# Patient Record
Sex: Male | Born: 1955 | Race: White | Hispanic: No | Smoking: Former smoker
Health system: Southern US, Community
[De-identification: ages and names within clinical notes are randomized; demographics above are authoritative.]

## PROBLEM LIST (undated history)

## (undated) DIAGNOSIS — Z8619 Personal history of other infectious and parasitic diseases: Secondary | ICD-10-CM

## (undated) DIAGNOSIS — E781 Pure hyperglyceridemia: Secondary | ICD-10-CM

## (undated) HISTORY — DX: Personal history of other infectious and parasitic diseases: Z86.19

## (undated) HISTORY — DX: Pure hyperglyceridemia: E78.1

---

## 1981-09-05 HISTORY — PX: WISDOM TOOTH EXTRACTION: SHX21

## 1999-03-22 ENCOUNTER — Emergency Department (HOSPITAL_COMMUNITY): Admission: EM | Admit: 1999-03-22 | Discharge: 1999-03-22 | Payer: Self-pay | Admitting: Emergency Medicine

## 2013-04-29 ENCOUNTER — Emergency Department: Payer: Self-pay | Admitting: Emergency Medicine

## 2013-04-29 LAB — BASIC METABOLIC PANEL
Anion Gap: 5 — ABNORMAL LOW (ref 7–16)
BUN: 20 mg/dL — ABNORMAL HIGH (ref 7–18)
Calcium, Total: 9.1 mg/dL (ref 8.5–10.1)
Chloride: 108 mmol/L — ABNORMAL HIGH (ref 98–107)
Co2: 26 mmol/L (ref 21–32)
EGFR (Non-African Amer.): >60
Glucose: 107 mg/dL — ABNORMAL HIGH (ref 65–99)
Osmolality: 281 (ref 275–301)
Potassium: 4.4 mmol/L (ref 3.5–5.1)
Sodium: 139 mmol/L (ref 136–145)

## 2013-04-29 LAB — CBC
HCT: 46.6 % (ref 40.0–52.0)
MCV: 85 fL (ref 80–100)
Platelet: 218 10*3/uL (ref 150–440)
RDW: 13 % (ref 11.5–14.5)
WBC: 7.8 10*3/uL (ref 3.8–10.6)

## 2014-03-31 LAB — LIPID PANEL
Cholesterol: 156 mg/dL (ref 0–200)
HDL: 39 mg/dL (ref 35–70)
LDL CALC: 98 mg/dL
Triglycerides: 97 mg/dL (ref 40–160)

## 2014-03-31 LAB — PSA: PSA: 0.6

## 2015-09-15 ENCOUNTER — Other Ambulatory Visit: Payer: Self-pay | Admitting: Family Medicine

## 2015-09-15 NOTE — Telephone Encounter (Signed)
OK to send, but I can't pharmacy in [place].

## 2015-09-15 NOTE — Telephone Encounter (Signed)
Ok to send in medication into pharmacy?

## 2015-09-15 NOTE — Telephone Encounter (Signed)
Pt contacted office for refill request on the following medications: Lisinopril 20 mg to [facility]. Pt last OV was on 03/24/14 and last RX was 05/07/14. Pt is scheduled for f/u on 09/21/15 but he is out and would like the refill sent in today if possible. Thanks TNP

## 2015-09-18 DIAGNOSIS — I10 Essential (primary) hypertension: Secondary | ICD-10-CM | POA: Insufficient documentation

## 2015-09-18 DIAGNOSIS — E781 Pure hyperglyceridemia: Secondary | ICD-10-CM | POA: Insufficient documentation

## 2015-09-18 HISTORY — DX: Pure hyperglyceridemia: E78.1

## 2015-09-21 ENCOUNTER — Ambulatory Visit (INDEPENDENT_AMBULATORY_CARE_PROVIDER_SITE_OTHER): Payer: BLUE CROSS/BLUE SHIELD | Admitting: Family Medicine

## 2015-09-21 ENCOUNTER — Encounter: Payer: Self-pay | Admitting: Family Medicine

## 2015-09-21 VITALS — BP 142/80 | HR 72 | Temp 98.3°F | Resp 16 | Ht 70.0 in | Wt 246.0 lb

## 2015-09-21 DIAGNOSIS — Z23 Encounter for immunization: Secondary | ICD-10-CM | POA: Diagnosis not present

## 2015-09-21 DIAGNOSIS — I10 Essential (primary) hypertension: Secondary | ICD-10-CM

## 2015-09-21 DIAGNOSIS — E781 Pure hyperglyceridemia: Secondary | ICD-10-CM | POA: Diagnosis not present

## 2015-09-21 MED ORDER — LISINOPRIL 20 MG PO TABS: 20.0000 mg | ORAL_TABLET | Freq: Every day | ORAL | Status: DC

## 2015-09-21 NOTE — Patient Instructions (Signed)
DASH Eating Plan  DASH stands for "Dietary Approaches to Stop Hypertension." The DASH eating plan is a healthy eating plan that has been shown to reduce high blood pressure (hypertension). Additional health benefits may include reducing the risk of type 2 diabetes mellitus, heart disease, and stroke. The DASH eating plan may also help with weight loss.  WHAT DO I NEED TO KNOW ABOUT THE DASH EATING PLAN?  For the DASH eating plan, you will follow these general guidelines:  · Choose foods with a percent daily value for sodium of less than 5% (as listed on the food label).  · Use salt-free seasonings or herbs instead of table salt or sea salt.  · Check with your health care provider or pharmacist before using salt substitutes.  · Eat lower-sodium products, often labeled as "lower sodium" or "no salt added."  · Eat fresh foods.  · Eat more vegetables, fruits, and low-fat dairy products.  · Choose whole grains. Look for the word "whole" as the first word in the ingredient list.  · Choose fish and skinless chicken or turkey more often than red meat. Limit fish, poultry, and meat to 6 oz (170 g) each day.  · Limit sweets, desserts, sugars, and sugary drinks.  · Choose heart-healthy fats.  · Limit cheese to 1 oz (28 g) per day.  · Eat more home-cooked food and less restaurant, buffet, and fast food.  · Limit fried foods.  · Cook foods using methods other than frying.  · Limit canned vegetables. If you do use them, rinse them well to decrease the sodium.  · When eating at a restaurant, ask that your food be prepared with less salt, or no salt if possible.  WHAT FOODS CAN I EAT?  Seek help from a dietitian for individual calorie needs.  Grains  Whole grain or whole wheat bread. Brown rice. Whole grain or whole wheat pasta. Quinoa, bulgur, and whole grain cereals. Low-sodium cereals. Corn or whole wheat flour tortillas. Whole grain cornbread. Whole grain crackers. Low-sodium crackers.  Vegetables  Fresh or frozen vegetables  (raw, steamed, roasted, or grilled). Low-sodium or reduced-sodium tomato and vegetable juices. Low-sodium or reduced-sodium tomato sauce and paste. Low-sodium or reduced-sodium canned vegetables.   Fruits  All fresh, canned (in natural juice), or frozen fruits.  Meat and Other Protein Products  Ground beef (85% or leaner), grass-fed beef, or beef trimmed of fat. Skinless chicken or turkey. Ground chicken or turkey. Pork trimmed of fat. All fish and seafood. Eggs. Dried beans, peas, or lentils. Unsalted nuts and seeds. Unsalted canned beans.  Dairy  Low-fat dairy products, such as skim or 1% milk, 2% or reduced-fat cheeses, low-fat ricotta or cottage cheese, or plain low-fat yogurt. Low-sodium or reduced-sodium cheeses.  Fats and Oils  Tub margarines without trans fats. Light or reduced-fat mayonnaise and salad dressings (reduced sodium). Avocado. Safflower, olive, or canola oils. Natural peanut or almond butter.  Other  Unsalted popcorn and pretzels.  The items listed above may not be a complete list of recommended foods or beverages. Contact your dietitian for more options.  WHAT FOODS ARE NOT RECOMMENDED?  Grains  White bread. White pasta. White rice. Refined cornbread. Bagels and croissants. Crackers that contain trans fat.  Vegetables  Creamed or fried vegetables. Vegetables in a cheese sauce. Regular canned vegetables. Regular canned tomato sauce and paste. Regular tomato and vegetable juices.  Fruits  Dried fruits. Canned fruit in light or heavy syrup. Fruit juice.  Meat and Other Protein   Products  Fatty cuts of meat. Ribs, chicken wings, bacon, sausage, bologna, salami, chitterlings, fatback, hot dogs, bratwurst, and packaged luncheon meats. Salted nuts and seeds. Canned beans with salt.  Dairy  Whole or 2% milk, cream, half-and-half, and cream cheese. Whole-fat or sweetened yogurt. Full-fat cheeses or blue cheese. Nondairy creamers and whipped toppings. Processed cheese, cheese spreads, or cheese  curds.  Condiments  Onion and garlic salt, seasoned salt, table salt, and sea salt. Canned and packaged gravies. Worcestershire sauce. Tartar sauce. Barbecue sauce. Teriyaki sauce. Soy sauce, including reduced sodium. Steak sauce. Fish sauce. Oyster sauce. Cocktail sauce. Horseradish. Ketchup and mustard. Meat flavorings and tenderizers. Bouillon cubes. Hot sauce. Tabasco sauce. Marinades. Taco seasonings. Relishes.  Fats and Oils  Butter, stick margarine, lard, shortening, ghee, and bacon fat. Coconut, palm kernel, or palm oils. Regular salad dressings.  Other  Pickles and olives. Salted popcorn and pretzels.  The items listed above may not be a complete list of foods and beverages to avoid. Contact your dietitian for more information.  WHERE CAN I FIND MORE INFORMATION?  National Heart, Lung, and Blood Institute: www.nhlbi.nih.gov/health/health-topics/topics/dash/     This information is not intended to replace advice given to you by your health care provider. Make sure you discuss any questions you have with your health care provider.     Document Released: 08/11/2011 Document Revised: 09/12/2014 Document Reviewed: 06/26/2013  Elsevier Interactive Patient Education ©2016 Elsevier Inc.

## 2015-09-21 NOTE — Progress Notes (Signed)
Patient ID: Jared Mccann, male   DOB: 05/26/1956, 60 y.o.   MRN: [id number]       Patient: Jared Mccann Male    DOB: Jan 09, 1956   60 y.o.   MRN: [id number] Visit Date: 09/21/2015  Today's Provider: Lelon Huh, MD   Chief Complaint  Patient presents with  . Hypertension    follow up   . Hyperlipidemia    follow up    Subjective:    HPI  Hypertension, follow-up:  BP Readings from Last 3 Encounters:  09/21/15 142/80  03/24/14 116/80    He was last seen for hypertension 1 years ago.  BP at that visit was 116/80. Management since that visit includes no changes. He reports fair compliance with treatment. He has only taken lisinopril twice in the last week due to medication running low, but had previously been taking it consistently every day. He is not having side effects.  He is not exercising. Outside blood pressures are not being checked. He is experiencing none.  Patient denies none.   Cardiovascular risk factors include dyslipidemia.    Weight trend: fluctuating a bit Wt Readings from Last 3 Encounters:  09/21/15 246 lb (111.585 kg)  03/24/14 237 lb (107.502 kg)    Current diet: well balanced  Lipid/Cholesterol, Follow-up:   Last seen for this1 years ago.  Management changes since that visit include none. . Last Lipid Panel:    Component Value Date/Time   CHOL 156 03/31/2014   TRIG 97 03/31/2014   HDL 39 03/31/2014   LDLCALC 98 03/31/2014    Risk factors for vascular disease include hypertension  He reports excellent compliance with treatment. He is not having side effects.  Current symptoms include none and have been stable. Weight trend: stable Prior visit with dietician: no Current diet: well balanced Current exercise: none  Wt Readings from Last 3 Encounters:  09/21/15 246 lb (111.585 kg)  03/24/14 237 lb (107.502 kg)    ------------------------------------------------------------------------       No Known  Allergies Previous Medications   ASPIRIN 81 MG TABLET    Take 1 tablet by mouth daily.   GLUCOSAMINE-CHONDROIT-VIT C-MN PO    Take 1 tablet by mouth daily.   KRILL OIL 300 MG CAPS    Take 1 tablet by mouth daily.   LISINOPRIL (PRINIVIL,ZESTRIL) 20 MG TABLET    Take 1 tablet by mouth daily.   MULTIPLE VITAMINS-MINERALS (MULTIVITAMIN ADULT PO)    Take 1 tablet by mouth daily.    Review of Systems  Constitutional: Negative.   Respiratory: Negative.   Cardiovascular: Negative.   Musculoskeletal: Negative.   Neurological: Negative.     Social History  Substance Use Topics  . Smoking status: Former Smoker -- 0.50 packs/day for 10 years    Types: Cigarettes    Quit date: 09/06/1983  . Smokeless tobacco: Not on file  . Alcohol Use: 0.0 oz/week    0 Standard drinks or equivalent per week     Comment: occasional use   Objective:   BP 142/80 mmHg  Pulse 72  Temp(Src) 98.3 F (36.8 C)  Resp 16  Ht 5\' 10"  (1.778 m)  Wt 246 lb (111.585 kg)  BMI 35.30 kg/m2  SpO2 95%  Physical Exam  General Appearance:    Alert, cooperative, no distress, obese  Eyes:    PERRL, conjunctiva/corneas clear, EOM's intact       Lungs:     Clear to auscultation bilaterally, respirations unlabored  Heart:  Regular rate and rhythm  Neurologic:   Awake, alert, oriented x 3. No apparent focal neurological           defect.           Assessment & Plan:     1. Hypertriglyceridemia  - Lipid panel  2. Essential (primary) hypertension Up today since running out of medication. Check labs and restart lisinopril - Renal function panel - lisinopril (PRINIVIL,ZESTRIL) 20 MG tablet; Take 1 tablet (20 mg total) by mouth daily.  Dispense: 30 tablet; Refill: 12  3. Need for influenza vaccination  - Flu Vaccine QUAD 36+ mos IM  4. Need for Tdap vaccination  - Tdap vaccine greater than or equal to 7yo IM       Lelon Huh, MD  [facility]

## 2015-09-22 ENCOUNTER — Encounter: Payer: Self-pay | Admitting: Family Medicine

## 2015-09-25 MED ORDER — LISINOPRIL 20 MG PO TABS: 20.0000 mg | ORAL_TABLET | Freq: Every day | ORAL | Status: DC

## 2015-09-28 LAB — BASIC METABOLIC PANEL
BUN: 14 mg/dL (ref 4–21)
Creatinine: 1.3 mg/dL (ref 0.6–1.3)
GLUCOSE: 75 mg/dL
Potassium: 4.9 mmol/L (ref 3.4–5.3)
SODIUM: 141 mmol/L (ref 137–147)

## 2015-09-28 LAB — LIPID PANEL
Cholesterol: 159 mg/dL (ref 0–200)
HDL: 39 mg/dL (ref 35–70)
LDL CALC: 97 mg/dL
Triglycerides: 115 mg/dL (ref 40–160)

## 2015-09-30 ENCOUNTER — Encounter: Payer: Self-pay | Admitting: Family Medicine

## 2015-09-30 ENCOUNTER — Ambulatory Visit (INDEPENDENT_AMBULATORY_CARE_PROVIDER_SITE_OTHER): Payer: BLUE CROSS/BLUE SHIELD | Admitting: Family Medicine

## 2015-09-30 VITALS — BP 138/78 | HR 68 | Temp 98.7°F | Resp 16

## 2015-09-30 DIAGNOSIS — I10 Essential (primary) hypertension: Secondary | ICD-10-CM | POA: Diagnosis not present

## 2015-09-30 DIAGNOSIS — H60322 Hemorrhagic otitis externa, left ear: Secondary | ICD-10-CM

## 2015-09-30 MED ORDER — NEOMYCIN-POLYMYXIN-HC 3.5-10000-1 OT SOLN: 3.0000 [drp] | Freq: Four times a day (QID) | OTIC | Status: DC

## 2015-09-30 NOTE — Progress Notes (Signed)
Patient ID: Jared Mccann, male   DOB: 07-25-1956, 60 y.o.   MRN: [id number]       Patient: Jared Mccann Male    DOB: 01-Dec-1955   60 y.o.   MRN: [id number] Visit Date: 09/30/2015  Today's Provider: Lelon Huh, MD   Chief Complaint  Patient presents with  . Ear Pain    left ear X 1 week.    Subjective:    Otalgia  There is pain in the left ear. This is a new problem. The current episode started in the past 7 days. The problem has been gradually worsening. There has been no fever. The pain is at a severity of 4/10. The pain is mild. Pertinent negatives include no coughing, ear discharge, hearing loss or neck pain. He has tried acetaminophen for the symptoms. The treatment provided no relief.  Patient reports that the pain radiates to the left side of his jaw. He reports that the pain is worse when opening and closing his mouth.    -------------------------------------------------------------------   Hypertension, follow-up:  BP Readings from Last 3 Encounters:  09/30/15 138/78  09/21/15 142/80  03/24/14 116/80   He reports good compliance with treatment. He is not having side effects.  He is not exercising. He is adherent to low salt diet.   Outside blood pressures are normal.  Patient denies chest pain, dyspnea, fatigue, irregular heart beat, lower extremity edema, orthopnea and palpitations.     He had labs done yesterday with normal lipids and renal panel.   Weight trend: stable Wt Readings from Last 3 Encounters:  09/21/15 246 lb (111.585 kg)  03/24/14 237 lb (107.502 kg)    ------------------------------------------------------------------------  Anxiety He reports he was previously on lithium carbonate with remote diagnosis of BPAD. He states that lithium really helped calms his nerves when he took it the past and he feels he needs to be back on it. Was last prescribed 300mg  twice a day in 2008.      No Known Allergies Previous Medications   ASPIRIN  81 MG TABLET    Take 1 tablet by mouth daily.   GLUCOSAMINE-CHONDROIT-VIT C-MN PO    Take 1 tablet by mouth daily.   KRILL OIL 300 MG CAPS    Take 1 tablet by mouth daily.   LISINOPRIL (PRINIVIL,ZESTRIL) 20 MG TABLET    Take 1 tablet (20 mg total) by mouth daily.   MULTIPLE VITAMINS-MINERALS (MULTIVITAMIN ADULT PO)    Take 1 tablet by mouth daily.    Review of Systems  Constitutional: Negative.   HENT: Positive for ear pain. Negative for congestion, ear discharge, hearing loss, postnasal drip and sinus pressure.   Respiratory: Negative for cough.   Musculoskeletal: Negative for neck pain.    Social History  Substance Use Topics  . Smoking status: Former Smoker -- 0.50 packs/day for 10 years    Types: Cigarettes    Quit date: 09/06/1983  . Smokeless tobacco: Not on file  . Alcohol Use: 0.0 oz/week    0 Standard drinks or equivalent per week     Comment: occasional use   Objective:   BP 138/78 mmHg  Pulse 68  Temp(Src) 98.7 F (37.1 C)  Resp 16  Wt   SpO2 96%  Physical Exam  General appearance: alert, well developed, well nourished, cooperative and in no distress Head: Normocephalic, without obvious abnormality, atraumatic Lungs: Respirations even and unlabored Extremities: No gross deformities Ear: Left ear canal with small amount cerumen. Mild erythema and slightly  swollen. TM clear Psych: Appropriate mood and affect. Neurologic: Mental status: Alert, oriented to person, place, and time, thought content appropriate.     Assessment & Plan:     1. Otitis externa hemorrhagica, left  - neomycin-polymyxin-hydrocortisone (CORTISPORIN) otic solution; Place 3 drops into the left ear 4 (four) times daily.  Dispense: 10 mL; Refill: 0  2. Essential (primary) hypertension Well controlled.  Continue current medications.    3. Anxiety By his history has remote diagnosis of BPAD. Will start back on lithium carbonate 300mg  twice a day.        Lelon Huh, MD  [facility]

## 2015-10-02 ENCOUNTER — Telehealth: Payer: Self-pay | Admitting: Family Medicine

## 2015-10-07 ENCOUNTER — Encounter: Payer: Self-pay | Admitting: Family Medicine

## 2016-03-25 ENCOUNTER — Telehealth: Payer: Self-pay | Admitting: Family Medicine

## 2016-03-25 MED ORDER — LISINOPRIL 20 MG PO TABS: 20.0000 mg | ORAL_TABLET | Freq: Every day | ORAL | Status: DC

## 2016-03-25 NOTE — Telephone Encounter (Signed)
Please review-aa 

## 2016-03-25 NOTE — Telephone Encounter (Signed)
Pt contacted office for refill request on the following medications: lisinopril (PRINIVIL,ZESTRIL) 20 MG tablet to [facility]. Last written: 09/25/15 90 supply no refills Last OV: 09/30/15 Pt scheduled a F/U OV for 04/25/16. Thanks TNP

## 2016-04-08 NOTE — Telephone Encounter (Signed)
error 

## 2016-04-11 ENCOUNTER — Telehealth: Payer: Self-pay | Admitting: Family Medicine

## 2016-04-11 MED ORDER — LITHIUM CARBONATE 300 MG PO TABS: 300.0000 mg | ORAL_TABLET | Freq: Two times a day (BID) | ORAL | 1 refills | Status: DC

## 2016-04-11 NOTE — Telephone Encounter (Signed)
Refill lithium that was restarted at 09-30-15 o.v. For anxiety

## 2016-04-25 ENCOUNTER — Ambulatory Visit: Payer: BLUE CROSS/BLUE SHIELD | Admitting: Family Medicine

## 2016-05-16 ENCOUNTER — Ambulatory Visit: Payer: BLUE CROSS/BLUE SHIELD | Admitting: Family Medicine

## 2017-01-13 ENCOUNTER — Other Ambulatory Visit: Payer: Self-pay

## 2017-01-13 MED ORDER — LISINOPRIL 20 MG PO TABS: 20.0000 mg | ORAL_TABLET | Freq: Every day | ORAL | 0 refills | Status: DC

## 2017-01-13 NOTE — Telephone Encounter (Signed)
Patient called needing refill for his Lisinopril. I made his appointment to see you on Wednesday 01/16/17, due to his work schedule that was the earliest I could get him in. I updated his pharmacy in the chart. Please review. Thank you-aa

## 2017-01-18 ENCOUNTER — Ambulatory Visit (INDEPENDENT_AMBULATORY_CARE_PROVIDER_SITE_OTHER): Payer: BLUE CROSS/BLUE SHIELD | Admitting: Family Medicine

## 2017-01-18 ENCOUNTER — Encounter: Payer: Self-pay | Admitting: Family Medicine

## 2017-01-18 VITALS — BP 120/80 | HR 70 | Temp 98.8°F | Resp 16 | Ht 70.0 in | Wt 250.0 lb

## 2017-01-18 DIAGNOSIS — Z1159 Encounter for screening for other viral diseases: Secondary | ICD-10-CM

## 2017-01-18 DIAGNOSIS — Z1211 Encounter for screening for malignant neoplasm of colon: Secondary | ICD-10-CM

## 2017-01-18 DIAGNOSIS — I10 Essential (primary) hypertension: Secondary | ICD-10-CM | POA: Diagnosis not present

## 2017-01-18 DIAGNOSIS — Z125 Encounter for screening for malignant neoplasm of prostate: Secondary | ICD-10-CM | POA: Diagnosis not present

## 2017-01-18 MED ORDER — NEOMYCIN-POLYMYXIN-HC 3.5-10000-1 OT SOLN: 3.0000 [drp] | Freq: Four times a day (QID) | OTIC | 0 refills | Status: DC

## 2017-01-18 NOTE — Progress Notes (Signed)
Patient: Jared Mccann Male    DOB: Mar 22, 1956   61 y.o.   MRN: [id number] Visit Date: 01/18/2017  Today's Provider: Lelon Huh, MD   Chief Complaint  Patient presents with  . Follow-up  . Hypertension  . Hyperlipidemia   Subjective:    HPI   Hypertension, follow-up:  BP Readings from Last 3 Encounters:  01/18/17 120/80  09/30/15 138/78  09/21/15 (!) 142/80    He was last seen for hypertension 09/21/2015.  BP at that visit was 142/80. Management since that visit includes; restarted Lisinopril.He reports good compliance with treatment. He is not having side effects. none He is exercising. He is adherent to low salt diet.   Outside blood pressures are usually high. He is experiencing none.  Patient denies none.   Cardiovascular risk factors include none.  Use of agents associated with hypertension: none.   ----------------------------------------------------------------    Lipid/Cholesterol, Follow-up:   Last seen for this 09/21/2015.  Management since that visit includes; labs checked, no changes.  Last Lipid Panel:    Component Value Date/Time   CHOL 159 09/28/2015   TRIG 115 09/28/2015   HDL 39 09/28/2015   LDLCALC 97 09/28/2015    He reports good compliance with treatment. He is not having side effects. none  Wt Readings from Last 3 Encounters:  01/18/17 250 lb (113.4 kg)  09/21/15 246 lb (111.6 kg)  03/24/14 237 lb (107.5 kg)    ----------------------------------------------------------------    No Known Allergies   Current Outpatient Prescriptions:  .  aspirin 81 MG tablet, Take 1 tablet by mouth daily., Disp: , Rfl:  .  Doxylamine Succinate, Sleep, (SLEEP AID PO), Take by mouth. Nyquil, Disp: , Rfl:  .  GLUCOSAMINE-CHONDROIT-VIT C-MN PO, Take 1 tablet by mouth daily., Disp: , Rfl:  .  Krill Oil 300 MG CAPS, Take 1 tablet by mouth daily., Disp: , Rfl:  .  lisinopril (PRINIVIL,ZESTRIL) 20 MG tablet, Take 1 tablet (20 mg total)  by mouth daily., Disp: 90 tablet, Rfl: 0 .  MELATONIN PO, Take by mouth., Disp: , Rfl:  .  Multiple Vitamins-Minerals (MULTIVITAMIN ADULT PO), Take 1 tablet by mouth daily., Disp: , Rfl:  .  neomycin-polymyxin-hydrocortisone (CORTISPORIN) otic solution, Place 3 drops into the left ear 4 (four) times daily., Disp: 10 mL, Rfl: 0 .  Saw Palmetto, Serenoa repens, (SAW PALMETTO PO), Take by mouth., Disp: , Rfl:  .  TURMERIC PO, Take by mouth., Disp: , Rfl:   Review of Systems  Constitutional: Negative for appetite change, chills and fever.  Respiratory: Negative for chest tightness, shortness of breath and wheezing.   Cardiovascular: Negative for chest pain and palpitations.  Gastrointestinal: Negative for abdominal pain, nausea and vomiting.    Social History  Substance Use Topics  . Smoking status: Former Smoker    Packs/day: 0.50    Years: 10.00    Types: Cigarettes    Quit date: 09/06/1983  . Smokeless tobacco: Never Used  . Alcohol use 0.0 oz/week     Comment: occasional use   Objective:   BP 120/80 (BP Location: Right Arm, Patient Position: Sitting, Cuff Size: Large)   Pulse 70   Temp 98.8 F (37.1 C) (Oral)   Resp 16   Ht '5\' 10"'$  (1.778 m)   Wt 250 lb (113.4 kg)   SpO2 97%   BMI 35.87 kg/m  Vitals:   01/18/17 1131  BP: 120/80  Pulse: 70  Resp: 16  Temp: 98.8  F (37.1 C)  TempSrc: Oral  SpO2: 97%  Weight: 250 lb (113.4 kg)  Height: '5\' 10"'$  (1.778 m)   Depression screen [facility] 2/9 01/18/2017  Decreased Interest 0  Down, Depressed, Hopeless 1  PHQ - 2 Score 1  Altered sleeping 0  Tired, decreased energy 1  Change in appetite 0  Feeling bad or failure about yourself  0  Trouble concentrating 0  Moving slowly or fidgety/restless 0  Suicidal thoughts 0  PHQ-9 Score 2  Difficult doing work/chores Not difficult at all     Physical Exam   General Appearance:    Alert, cooperative, no distress  Eyes:    PERRL, conjunctiva/corneas clear, EOM's intact       Lungs:      Clear to auscultation bilaterally, respirations unlabored  Heart:    Regular rate and rhythm  Neurologic:   Awake, alert, oriented x 3. No apparent focal neurological           defect.           Assessment & Plan:     1. Essential (primary) hypertension Well controlled.  Continue current medications.   - Lipid panel - Renal function panel  2. Prostate cancer screening  - PSA  3. Need for hepatitis C screening test  - Hepatitis C antibody  4. Colon cancer screening Patient given OC-Lyte iFOBT collection kit today.   The entirety of the information documented in the History of Present Illness, Review of Systems and Physical Exam were personally obtained by me. Portions of this information were initially documented by April M. Sabra Heck, CMA and reviewed by me for thoroughness and accuracy.        Lelon Huh, MD  [facility]

## 2017-01-23 ENCOUNTER — Other Ambulatory Visit (INDEPENDENT_AMBULATORY_CARE_PROVIDER_SITE_OTHER): Payer: BLUE CROSS/BLUE SHIELD

## 2017-01-23 ENCOUNTER — Other Ambulatory Visit: Payer: Self-pay | Admitting: Family Medicine

## 2017-01-23 ENCOUNTER — Telehealth: Payer: Self-pay

## 2017-01-23 DIAGNOSIS — Z1211 Encounter for screening for malignant neoplasm of colon: Secondary | ICD-10-CM

## 2017-01-23 DIAGNOSIS — R195 Other fecal abnormalities: Secondary | ICD-10-CM | POA: Insufficient documentation

## 2017-01-23 LAB — BASIC METABOLIC PANEL
BUN: 18 mg/dL (ref 4–21)
Creatinine: 1.2 mg/dL (ref 0.6–1.3)
GLUCOSE: 87 mg/dL
Potassium: 4.6 mmol/L (ref 3.4–5.3)
Sodium: 140 mmol/L (ref 137–147)

## 2017-01-23 LAB — LIPID PANEL
CHOLESTEROL: 177 mg/dL (ref 0–200)
HDL: 33 mg/dL — AB (ref 35–70)
LDL Cholesterol: 110 mg/dL
Triglycerides: 216 mg/dL — AB (ref 40–160)

## 2017-01-23 LAB — IFOBT (OCCULT BLOOD): IFOBT: POSITIVE

## 2017-01-23 LAB — PSA: PSA: 0.9

## 2017-01-23 NOTE — Telephone Encounter (Signed)
Pt was advised after he dropped off the sample that this was positive, and he was going to be referred to GI. Renaldo Fiddler, CMA

## 2017-01-23 NOTE — Telephone Encounter (Signed)
-----   Message from Birdie Sons, MD sent at 01/23/2017  1:27 PM EDT ----- Needs referral to GI for heme positive stool.

## 2017-01-23 NOTE — Progress Notes (Signed)
Refer colonosocpy

## 2017-01-23 NOTE — Progress Notes (Signed)
Pt dropped off OC lyte sample, and it was positive. Please advise. Renaldo Fiddler, CMA

## 2017-01-24 LAB — RENAL FUNCTION PANEL
ALBUMIN: 4.3
CALCIUM: 9.4 mg/dL
Carbon Dioxide, Total: 24
Chloride: 107 mmol/L
EGFR (African American): 78
EGFR (Non-African Amer.): 67
PHOSPHORUS: 3.1

## 2017-01-24 LAB — LIPID PANEL
CHOL/HDL RATIO: 5.4
NONHDL CHOLESTEROL: 144

## 2017-01-25 ENCOUNTER — Encounter: Payer: Self-pay | Admitting: *Deleted

## 2017-01-27 ENCOUNTER — Encounter: Payer: Self-pay | Admitting: Internal Medicine

## 2017-02-02 ENCOUNTER — Encounter: Payer: Self-pay | Admitting: Family Medicine

## 2017-02-02 ENCOUNTER — Ambulatory Visit
Admission: RE | Admit: 2017-02-02 | Discharge: 2017-02-02 | Disposition: A | Discharge status: Home / Self Care | Payer: BLUE CROSS/BLUE SHIELD | Source: Ambulatory Visit | Attending: Chiropractic Medicine | Admitting: Chiropractic Medicine

## 2017-02-02 ENCOUNTER — Other Ambulatory Visit: Payer: Self-pay | Admitting: Chiropractic Medicine

## 2017-02-02 DIAGNOSIS — M25511 Pain in right shoulder: Secondary | ICD-10-CM

## 2017-02-22 ENCOUNTER — Telehealth: Payer: Self-pay | Admitting: Family Medicine

## 2017-02-22 NOTE — Telephone Encounter (Signed)
Patient was given lab results from May 2018.

## 2017-02-22 NOTE — Telephone Encounter (Signed)
Pt is requesting lab results.  XA#[phone]

## 2017-03-22 ENCOUNTER — Telehealth: Payer: Self-pay | Admitting: *Deleted

## 2017-03-22 NOTE — Telephone Encounter (Signed)
Patient no show pre-visit appointment today. Called patient. No answer, no voicemail, unable to leave message.

## 2017-03-23 ENCOUNTER — Encounter: Payer: Self-pay | Admitting: Family Medicine

## 2017-04-05 ENCOUNTER — Encounter: Payer: Self-pay | Admitting: Internal Medicine

## 2017-04-19 ENCOUNTER — Other Ambulatory Visit: Payer: Self-pay | Admitting: Family Medicine

## 2017-04-19 MED ORDER — LISINOPRIL 20 MG PO TABS: 20.0000 mg | ORAL_TABLET | Freq: Every day | ORAL | 3 refills | Status: DC

## 2017-04-19 NOTE — Telephone Encounter (Signed)
Pt needs refill on his Lisinopril 20 mg  [facility]  (differnt pharmacy) [phone]  Pt's call back [phone]  Thanks Con Memos

## 2017-04-19 NOTE — Telephone Encounter (Signed)
No answer or voice mail

## 2017-05-06 DEATH — deceased

## 2018-05-29 ENCOUNTER — Other Ambulatory Visit: Payer: Self-pay | Admitting: Family Medicine

## 2018-05-29 MED ORDER — LISINOPRIL 20 MG PO TABS: 20.0000 mg | ORAL_TABLET | Freq: Every day | ORAL | 1 refills | Status: DC

## 2018-05-29 NOTE — Telephone Encounter (Signed)
Please advise. Patient has an appointment scheduled on 06/04/2018 at 8:40am.

## 2018-05-29 NOTE — Telephone Encounter (Signed)
Pt needing a refill on: lisinopril (PRINIVIL,ZESTRIL) 20 MG tablet    Please fill at:  [facility] [address], [place] AT [facility] OF [place] [phone] (Phone) [phone] (Fax)    Thanks, [employer]

## 2018-06-04 ENCOUNTER — Ambulatory Visit: Payer: BLUE CROSS/BLUE SHIELD | Admitting: Family Medicine

## 2018-06-04 ENCOUNTER — Encounter: Payer: Self-pay | Admitting: Family Medicine

## 2018-06-04 VITALS — BP 122/80 | HR 58 | Temp 97.5°F | Resp 16 | Wt 229.0 lb

## 2018-06-04 DIAGNOSIS — I10 Essential (primary) hypertension: Secondary | ICD-10-CM

## 2018-06-04 DIAGNOSIS — Z23 Encounter for immunization: Secondary | ICD-10-CM | POA: Diagnosis not present

## 2018-06-04 DIAGNOSIS — Z125 Encounter for screening for malignant neoplasm of prostate: Secondary | ICD-10-CM

## 2018-06-04 DIAGNOSIS — R195 Other fecal abnormalities: Secondary | ICD-10-CM | POA: Diagnosis not present

## 2018-06-04 MED ORDER — LISINOPRIL 20 MG PO TABS: 20.0000 mg | ORAL_TABLET | Freq: Every day | ORAL | 1 refills | Status: DC

## 2018-06-04 NOTE — Progress Notes (Signed)
Patient: Jared Mccann Male    DOB: 10-Sep-1955   62 y.o.   MRN: [id number] Visit Date: 06/04/2018  Today's Provider: Lelon Huh, MD   Chief Complaint  Patient presents with  . Hypertension   Subjective:    HPI   Hypertension, follow-up:  BP Readings from Last 3 Encounters:  06/04/18 122/80  01/18/17 120/80  09/30/15 138/78    He was last seen for hypertension 4 months ago.  BP at that visit was 120/80. Management since that visit includes; no changes.He reports good compliance with treatment. He is not having side effects.  He is not exercising regularly. He is adherent to low salt diet.   Outside blood pressures are checked occasionally. He is experiencing none.  Patient denies chest pain, chest pressure/discomfort, claudication, dyspnea, exertional chest pressure/discomfort, fatigue, irregular heart beat, lower extremity edema, near-syncope, orthopnea, palpitations, paroxysmal nocturnal dyspnea, syncope and tachypnea.   Cardiovascular risk factors include advanced age (older than 27 for men, 61 for women), hypertension and male gender.  Use of agents associated with hypertension: none.   Wt Readings from Last 5 Encounters:  06/04/18 229 lb (103.9 kg)  01/18/17 250 lb (113.4 kg)  09/21/15 246 lb (111.6 kg)  03/24/14 237 lb (107.5 kg)   He states he has been working on losing weight and was on strict keto diet for awhile. Has relaxed diet a bit and gained back a few pounds, but is still 20 pounds less than at last visit.  ------------------------------------------------------------------------  Follow up occult blood in stool  He has positive OC-Lyte 01-23-2017 and referred to GI. However he did not show for appointment. He says now that he is willing to go GI if OC-Lyte is still positive.   No Known Allergies   Current Outpatient Medications:  .  Doxylamine Succinate, Sleep, (SLEEP AID PO), Take by mouth. Nyquil, Disp: , Rfl:  .   GLUCOSAMINE-CHONDROIT-VIT C-MN PO, Take 1 tablet by mouth daily., Disp: , Rfl:  .  Krill Oil 300 MG CAPS, Take 1 tablet by mouth daily., Disp: , Rfl:  .  lisinopril (PRINIVIL,ZESTRIL) 20 MG tablet, Take 1 tablet (20 mg total) by mouth daily., Disp: 90 tablet, Rfl: 1 .  MAGNESIUM PO, Take 1 tablet by mouth daily., Disp: , Rfl:  .  MELATONIN PO, Take by mouth., Disp: , Rfl:  .  Multiple Vitamins-Minerals (MULTIVITAMIN ADULT PO), Take 1 tablet by mouth daily., Disp: , Rfl:  .  Saw Palmetto, Serenoa repens, (SAW PALMETTO PO), Take by mouth., Disp: , Rfl:  .  TURMERIC PO, Take by mouth., Disp: , Rfl:  .  neomycin-polymyxin-hydrocortisone (CORTISPORIN) otic solution, Place 3 drops into the left ear 4 (four) times daily. (Patient not taking: Reported on 06/04/2018), Disp: 10 mL, Rfl: 0  Review of Systems  Constitutional: Negative for appetite change, chills and fever.  Respiratory: Negative for chest tightness, shortness of breath and wheezing.   Cardiovascular: Negative for chest pain and palpitations.  Gastrointestinal: Negative for abdominal pain, nausea and vomiting.    Social History   Tobacco Use  . Smoking status: Former Smoker    Packs/day: 0.50    Years: 10.00    Pack years: 5.00    Types: Cigarettes    Last attempt to quit: 09/06/1983    Years since quitting: 34.7  . Smokeless tobacco: Current User    Types: Snuff  Substance Use Topics  . Alcohol use: Yes    Alcohol/week: 0.0 standard drinks  Comment: occasional use   Objective:   BP 122/80 (BP Location: Left Arm, Patient Position: Sitting, Cuff Size: Large)   Pulse (!) 58   Temp (!) 97.5 F (36.4 C) (Oral)   Resp 16   Wt 229 lb (103.9 kg)   SpO2 98% Comment: room air  BMI 32.86 kg/m  Vitals:   06/04/18 0907  BP: 122/80  Pulse: (!) 58  Resp: 16  Temp: (!) 97.5 F (36.4 C)  TempSrc: Oral  SpO2: 98%  Weight: 229 lb (103.9 kg)     Physical Exam  General Appearance:    Alert, cooperative, no distress, obese    Eyes:    PERRL, conjunctiva/corneas clear, EOM's intact       Lungs:     Clear to auscultation bilaterally, respirations unlabored  Heart:    Regular rate and rhythm  Neurologic:   Awake, alert, oriented x 3. No apparent focal neurological           defect.         Office Visit from 06/04/2018 in [place]  PHQ-2 Total Score  0         Assessment & Plan:     1. Essential (primary) hypertension Well controlled.  Continue current medications.  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 He was advised that his BP would likely exceed 140/90 without medications unless he loses considerable more weight. Will continue current medication for the time being.  Check lipids and met c.   2. Heme positive stool He did not show for GI referral last year. He would like to repeat stool test and agrees to have colonoscopy if positive again.   3. Prostate cancer screening - PSA      Lelon Huh, MD  [facility]

## 2018-06-08 ENCOUNTER — Telehealth: Payer: Self-pay

## 2018-06-08 DIAGNOSIS — Z1211 Encounter for screening for malignant neoplasm of colon: Secondary | ICD-10-CM

## 2018-06-08 NOTE — Telephone Encounter (Signed)
Pt dropped off an OC Light.  The test was negative.   Thanks,   -Mickel Baas

## 2018-06-08 NOTE — Telephone Encounter (Signed)
Advised patient as below.  

## 2018-06-08 NOTE — Telephone Encounter (Signed)
Please advise patient. Repeat in 1 year.

## 2018-12-05 ENCOUNTER — Other Ambulatory Visit: Payer: Self-pay

## 2018-12-05 NOTE — Telephone Encounter (Signed)
Patient calling for medication refill on the following medication. lisinopril (PRINIVIL,ZESTRIL) 20 MG tablet  Pharmacy:[facility], [place] [address]

## 2018-12-06 MED ORDER — LISINOPRIL 20 MG PO TABS: 20.0000 mg | ORAL_TABLET | Freq: Every day | ORAL | 4 refills | Status: DC

## 2019-05-30 ENCOUNTER — Other Ambulatory Visit: Payer: Self-pay

## 2019-05-30 ENCOUNTER — Other Ambulatory Visit: Payer: Self-pay | Admitting: Family Medicine

## 2019-09-03 ENCOUNTER — Other Ambulatory Visit: Payer: Self-pay | Admitting: Family Medicine

## 2019-09-03 NOTE — Telephone Encounter (Signed)
Requested medication (s) are due for refill today: yes  Requested medication (s) are on the active medication list: yes  Last refill:  06/04/2018  Future visit scheduled: no  Notes to clinic:  no valid encounter within last 6 months Review for refill   Requested Prescriptions  Pending Prescriptions Disp Refills   lisinopril (ZESTRIL) 20 MG tablet [Pharmacy Med Name: LISINOPRIL 20MG  TABLETS] 90 tablet 0    Sig: TAKE 1 TABLET BY MOUTH DAILY      Cardiovascular:  ACE Inhibitors Failed - 09/03/2019  9:29 AM      Failed - Cr in normal range and within 180 days    Creatinine  Date Value Ref Range Status  01/23/2017 1.2 0.6 - 1.3 mg/dL Final  04/29/2013 1.20 0.60 - 1.30 mg/dL Final          Failed - K in normal range and within 180 days    Potassium  Date Value Ref Range Status  01/23/2017 4.6 3.4 - 5.3 mmol/L Final  04/29/2013 4.4 3.5 - 5.1 mmol/L Final          Failed - Valid encounter within last 6 months    Recent Outpatient Visits           1 year ago Essential (primary) hypertension   [facility] Birdie Sons, MD   2 years ago Essential (primary) hypertension   [facility] Birdie Sons, MD   3 years ago Otitis externa hemorrhagica, left   [facility] Birdie Sons, MD   3 years ago Hypertriglyceridemia   [facility] Birdie Sons, MD              Passed - Patient is not pregnant      Passed - Last BP in normal range    BP Readings from Last 1 Encounters:  06/04/18 122/80

## 2020-03-09 ENCOUNTER — Ambulatory Visit: Payer: BC Managed Care – PPO | Attending: Internal Medicine

## 2020-03-09 DIAGNOSIS — Z23 Encounter for immunization: Secondary | ICD-10-CM

## 2020-03-09 NOTE — Progress Notes (Signed)
   Covid-19 Vaccination Clinic  Name:  MENDELL BONTEMPO    MRN: [id number] DOB: [birth year]  03/09/2020  Mr. Wyka was observed post Covid-19 immunization for 15 minutes without incident. He was provided with Vaccine Information Sheet and instruction to access the V-Safe system.   Mr. Beem was instructed to call 911 with any severe reactions post vaccine: Marland Kitchen Difficulty breathing  . Swelling of face and throat  . A fast heartbeat  . A bad rash all over body  . Dizziness and weakness   Immunizations Administered    Name Date Dose VIS Date Route   JANSSEN COVID-19 VACCINE 03/09/2020  9:07 AM 0.5 mL 11/02/2019 Intramuscular   Manufacturer: [employer]phonsa [employer]   Lot: [id number]   [place]: [id number]

## 2020-08-28 ENCOUNTER — Other Ambulatory Visit: Payer: Self-pay | Admitting: Family Medicine

## 2020-08-28 NOTE — Telephone Encounter (Signed)
Requested medications are due for refill today  yes Requested medications are on the active medication list yes  Last refill 3/31  Last visit 2019  Future visit scheduled no  Notes to clinic Failed protocol due to no valid visit within 6  months.

## 2020-12-14 ENCOUNTER — Other Ambulatory Visit: Payer: Self-pay | Admitting: Family Medicine

## 2020-12-14 NOTE — Telephone Encounter (Signed)
Requested medications are due for refill today.  Yes  Requested medications are on the active medications list. yes  Last refill. 05/30/2019  Future visit scheduled.   yes  Notes to clinic.  Upcoming appointment, but more than 3 months overdue.

## 2020-12-14 NOTE — Telephone Encounter (Signed)
Medication Refill - Medication:   lisinopril (ZESTRIL) 20 MG tablet  Has the patient contacted their pharmacy? Yes.  no refills left, pt stated he will run out this week, pt has future appt scheduled.   Preferred Pharmacy (with phone number or street name):   [id number] [address], [place] [address]  [place] [address]  Phone: [phone] Fax: [phone]    Agent: Please be advised that RX refills may take up to 3 business days. We ask that you follow-up with your pharmacy.

## 2020-12-17 ENCOUNTER — Other Ambulatory Visit: Payer: Self-pay | Admitting: Family Medicine

## 2020-12-29 ENCOUNTER — Ambulatory Visit: Payer: BC Managed Care – PPO | Admitting: Family Medicine

## 2020-12-29 ENCOUNTER — Other Ambulatory Visit: Payer: Self-pay | Admitting: Family Medicine

## 2020-12-29 ENCOUNTER — Other Ambulatory Visit: Payer: Self-pay

## 2020-12-29 VITALS — BP 149/96 | HR 84 | Ht 70.0 in | Wt 232.4 lb

## 2020-12-29 DIAGNOSIS — Z1211 Encounter for screening for malignant neoplasm of colon: Secondary | ICD-10-CM

## 2020-12-29 DIAGNOSIS — I10 Essential (primary) hypertension: Secondary | ICD-10-CM

## 2020-12-29 LAB — COMPLETE METABOLIC PANEL WITH GFR
AG Ratio: 2.3 (calc) (ref 1.0–2.5)
ALT: 19 U/L (ref 9–46)
AST: 20 U/L (ref 10–35)
Albumin: 4.5 g/dL (ref 3.6–5.1)
Alkaline phosphatase (APISO): 62 U/L (ref 35–144)
BUN/Creatinine Ratio: 17 (calc) (ref 6–22)
BUN: 22 mg/dL (ref 7–25)
CO2: 25 mmol/L (ref 20–32)
Calcium: 9.2 mg/dL (ref 8.6–10.3)
Chloride: 105 mmol/L (ref 98–110)
Creat: 1.32 mg/dL — ABNORMAL HIGH (ref 0.70–1.25)
GFR, Est African American: 67 mL/min/{1.73_m2} (ref >=60)
GFR, Est Non African American: 57 mL/min/{1.73_m2} — ABNORMAL LOW (ref >=60)
Globulin: 2 g/dL (calc) (ref 1.9–3.7)
Glucose, Bld: 78 mg/dL (ref 65–139)
Potassium: 4.2 mmol/L (ref 3.5–5.3)
Sodium: 138 mmol/L (ref 135–146)
Total Bilirubin: 0.7 mg/dL (ref 0.2–1.2)
Total Protein: 6.5 g/dL (ref 6.1–8.1)

## 2020-12-29 LAB — LIPID PANEL
Cholesterol: 175 mg/dL (ref <200)
HDL: 49 mg/dL (ref >=40)
LDL Cholesterol (Calc): 103 mg/dL (calc) — ABNORMAL HIGH
Non-HDL Cholesterol (Calc): 126 mg/dL (calc) (ref <130)
Total CHOL/HDL Ratio: 3.6 (calc) (ref <5.0)
Triglycerides: 130 mg/dL (ref <150)

## 2020-12-29 LAB — TSH: TSH: 2.54 mIU/L (ref 0.40–4.50)

## 2020-12-29 NOTE — Progress Notes (Signed)
Established patient visit   Patient: Jared Mccann   DOB: 1956/01/14   65 y.o. Male  MRN: [id number] Visit Date: 12/29/2020  Today's healthcare provider: Lelon Huh, MD   Chief Complaint  Patient presents with  . Hypertension   Subjective    HPI  Patient presents to get refill on BP medications. 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  BP at that visit was 122/80. Management since that visit includes none.  He reports excellent compliance with treatment.  He is not having side effects.  He is following a Keto diet. He is exercising. He does not smoke.  Use of agents associated with hypertension: none.   Outside blood pressures are n/a. Symptoms: No chest pain No chest pressure  No palpitations No syncope  No dyspnea No orthopnea  No paroxysmal nocturnal dyspnea No lower extremity edema   Pertinent labs: Lab Results  Component Value Date   CHOL 177 01/23/2017   HDL 33 (A) 01/23/2017   LDLCALC 110 01/23/2017   TRIG 216 (A) 01/23/2017   CHOLHDL 5.4 01/23/2017   Lab Results  Component Value Date   NA 140 01/23/2017   K 4.6 01/23/2017   CREATININE 1.2 01/23/2017   GFRNONAA 67 01/23/2017   GFRAA 78 01/23/2017   GLUCOSE 107 (H) 04/29/2013     The ASCVD Risk score (Goff DC Jr., et al., 2013) failed to calculate for the following reasons:   Cannot find a previous HDL lab   Cannot find a previous total cholesterol lab   ---------------------------------------------------------------------------------------------------      Medications: Outpatient Medications Prior to Visit  Medication Sig  . Doxylamine Succinate,  Sleep, (SLEEP AID PO) Take by mouth. Nyquil  . GLUCOSAMINE-CHONDROIT-VIT C-MN PO Take 1 tablet by mouth daily.  Javier Docker Oil 300 MG CAPS Take 1 tablet by mouth daily.  Marland Kitchen lisinopril (ZESTRIL) 20 MG tablet TAKE 1 TABLET(20 MG) BY MOUTH DAILY  . MAGNESIUM PO Take 1 tablet by mouth daily.  Marland Kitchen MELATONIN PO Take by mouth.  . Multiple Vitamins-Minerals (MULTIVITAMIN ADULT PO) Take 1 tablet by mouth daily.  . Saw Palmetto, Serenoa repens, (SAW PALMETTO PO) Take by mouth.  . TURMERIC PO Take by mouth.   No facility-administered medications prior to visit.        Objective    BP (!) 149/96 (BP Location: Right Arm, Patient Position: Sitting, Cuff Size: Large)   Pulse 84   Ht 5\' 10"  (1.778 m)   Wt 232 lb 6.4 oz (105.4 kg)   SpO2 98%   BMI 33.35 kg/m     Physical Exam    General: Appearance:    Mildly obese male in no acute distress  Eyes:    PERRL, conjunctiva/corneas clear, EOM's intact       Lungs:     Clear to auscultation bilaterally, respirations unlabored  Heart:    Normal heart rate. Normal rhythm. No murmurs, rubs, or gallops.   MS:   All extremities are intact.   Neurologic:   Awake, alert, oriented x 3. No apparent focal neurological  defect.          Assessment & Plan     1. Essential (primary) hypertension Uncontrolled, has CDL physical next week. Will increase lisinopril, or change to lisinopril-hctz after reviewing labs.  - TSH - Comprehensive metabolic panel - Lipid panel  2. Colon cancer screening  - Ambulatory referral to gastroenterology for colonoscopy       The entirety of the information documented in the History of Present Illness, Review of Systems and Physical Exam were personally obtained by me. Portions of this information were initially documented by the CMA and reviewed by me for thoroughness and accuracy.      Lelon Huh, MD  [facility] [phone] (phone) [phone] (fax)  [place]

## 2020-12-30 ENCOUNTER — Other Ambulatory Visit: Payer: Self-pay | Admitting: Family Medicine

## 2020-12-30 NOTE — Telephone Encounter (Signed)
Requested Prescriptions  Pending Prescriptions Disp Refills  . lisinopril (ZESTRIL) 20 MG tablet [Pharmacy Med Name: LISINOPRIL 20MG  TABLETS] 90 tablet 1    Sig: TAKE 1 TABLET(20 MG) BY MOUTH DAILY. THIS IS ENOUGH UNTIL PATIENT'S APPOINTMENT     Cardiovascular:  ACE Inhibitors Failed - 12/30/2020  3:43 AM      Failed - Cr in normal range and within 180 days    Creatinine  Date Value Ref Range Status  01/23/2017 1.2 0.6 - 1.3 mg/dL Final  04/29/2013 1.20 0.60 - 1.30 mg/dL Final         Failed - K in normal range and within 180 days    Potassium  Date Value Ref Range Status  01/23/2017 4.6 3.4 - 5.3 mmol/L Final  04/29/2013 4.4 3.5 - 5.1 mmol/L Final         Failed - Last BP in normal range    BP Readings from Last 1 Encounters:  12/29/20 (!) 149/96         Passed - Patient is not pregnant      Passed - Valid encounter within last 6 months    Recent Outpatient Visits          Yesterday Essential (primary) hypertension   [facility] Birdie Sons, MD   2 years ago Essential (primary) hypertension   [facility] Birdie Sons, MD   3 years ago Essential (primary) hypertension   [facility], Kirstie Peri, MD   5 years ago Otitis externa hemorrhagica, left   [facility] Birdie Sons, MD   5 years ago Hypertriglyceridemia   [facility] Birdie Sons, MD

## 2021-01-11 ENCOUNTER — Telehealth: Payer: Self-pay | Admitting: *Deleted

## 2021-01-11 DIAGNOSIS — I10 Essential (primary) hypertension: Secondary | ICD-10-CM

## 2021-01-11 NOTE — Telephone Encounter (Signed)
Pt called to regarding a letter that he received regarding his lalb results; message given to pt per Dr Lelon Huh " He can come in a few days before his appointment and have renal panel drawn. He should mainly just drink tap or plain bottled water"; the pt does not want to have his labs drawn at [facility]; he would like to have them done at [facility]. [address], [place]; he would like to be called back regarding what paperwork he needs to have his labs completed at that lab; the pt can be contacted at [phone]; pt reminded of appt date and time and to drink tap or bottled water prior to his lab appt; he verbalized understanding; will route to office for notification.

## 2021-01-11 NOTE — Addendum Note (Signed)
Addended by: Birdie Sons on: 01/11/2021 04:38 PM   Modules accepted: Orders

## 2021-01-11 NOTE — Telephone Encounter (Signed)
Please leave order for renal panel for him at lab.

## 2021-01-12 NOTE — Telephone Encounter (Signed)
Patient advised that lab order is ready for pick up. Order placed at the front desk of [address] for pick up.

## 2021-01-12 NOTE — Addendum Note (Signed)
Addended by: Randal Buba on: 01/12/2021 10:28 AM   Modules accepted: Orders

## 2021-01-22 DIAGNOSIS — I10 Essential (primary) hypertension: Secondary | ICD-10-CM | POA: Diagnosis not present

## 2021-01-22 LAB — COMPREHENSIVE METABOLIC PANEL
Calcium: 9.5 (ref 8.7–10.7)
GFR calc Af Amer: 57
GFR calc non Af Amer: 49

## 2021-01-22 LAB — BASIC METABOLIC PANEL
BUN: 20 (ref 4–21)
CO2: 22 (ref 13–22)
Chloride: 102 (ref 99–108)
Creatinine: 1.5 — AB (ref 0.6–1.3)
Glucose: 78
Potassium: 4.4 (ref 3.4–5.3)
Sodium: 137 (ref 137–147)

## 2021-01-22 LAB — PHOSPHORUS
Albumin: 4.6
BUN/Creatinine Ratio: 13
Phosphorus: 3.8

## 2021-01-26 ENCOUNTER — Ambulatory Visit (INDEPENDENT_AMBULATORY_CARE_PROVIDER_SITE_OTHER): Payer: BC Managed Care – PPO | Admitting: Family Medicine

## 2021-01-26 ENCOUNTER — Encounter: Payer: Self-pay | Admitting: Family Medicine

## 2021-01-26 ENCOUNTER — Other Ambulatory Visit: Payer: Self-pay

## 2021-01-26 VITALS — BP 132/80 | HR 65 | Temp 98.7°F | Resp 20 | Wt 231.4 lb

## 2021-01-26 DIAGNOSIS — I10 Essential (primary) hypertension: Secondary | ICD-10-CM | POA: Diagnosis not present

## 2021-01-26 MED ORDER — LISINOPRIL 20 MG PO TABS: 20.0000 mg | ORAL_TABLET | Freq: Two times a day (BID) | ORAL | 2 refills | Status: DC

## 2021-01-26 MED ORDER — LISINOPRIL 20 MG PO TABS: 20.0000 mg | ORAL_TABLET | Freq: Two times a day (BID) | ORAL | Status: DC

## 2021-01-26 NOTE — Progress Notes (Signed)
Established patient visit   Patient: Jared Mccann   DOB: 1956-03-16   65 y.o. Male  MRN: [id number] Visit Date: 01/26/2021  Today's healthcare provider: Lelon Huh, MD   Chief Complaint  Patient presents with  . Hypertension   Subjective    HPI  Hypertension, follow-up  BP Readings from Last 3 Encounters:  01/26/21 132/80  12/29/20 (!) 149/96  06/04/18 122/80   Wt Readings from Last 3 Encounters:  01/26/21 231 lb 6.4 oz (105 kg)  12/29/20 232 lb 6.4 oz (105.4 kg)  06/04/18 229 lb (103.9 kg)     He was last seen for hypertension on 12/29/2020.  BP at that visit was 149/96. Management since that visit includes ordering labs which showed slight decline in kidney functions. Patient was advised to drink more water. Lisinopril was increased to 20mg  twice a day, and patient was advised to follow up in 3 weeks to recheck blood pressure and kidney functions.  He reports good compliance with treatment. He is not having side effects.  He is following a Regular diet. He is exercising. He does not smoke.   He had labs done at [facility] last week showing creatinine of 1.51 and potassium of 4.4  Symptoms: No chest pain No chest pressure  No palpitations No syncope  No dyspnea No orthopnea  No paroxysmal nocturnal dyspnea No lower extremity edema    The 10-year ASCVD risk score [name], et al., 2013) is: 11.3%   ---------------------------------------------------------------------------------------------------     Medications: Outpatient Medications Prior to Visit  Medication Sig  . aspirin EC 81 MG tablet Take 81 mg by mouth daily. Swallow whole.  . Doxylamine Succinate, Sleep, (SLEEP AID PO) Take by mouth. Nyquil  . GLUCOSAMINE-CHONDROIT-VIT C-MN PO Take 1 tablet by mouth daily.  Javier Docker Oil 300 MG CAPS Take 1 tablet by mouth daily.  Marland Kitchen lisinopril (ZESTRIL) 20 MG tablet TAKE 1 TABLET(20 MG) BY MOUTH DAILY. THIS IS ENOUGH UNTIL PATIENT'S APPOINTMENT (Patient  taking differently: Take 40 mg by mouth daily.)  . MAGNESIUM PO Take 1 tablet by mouth daily.  Marland Kitchen MELATONIN PO Take by mouth.  . Multiple Vitamins-Minerals (MULTIVITAMIN ADULT PO) Take 1 tablet by mouth daily.  . Saw Palmetto, Serenoa repens, (SAW PALMETTO PO) Take by mouth.  . TURMERIC PO Take by mouth.   No facility-administered medications prior to visit.    Review of Systems  Constitutional: Negative for appetite change, chills and fever.  Respiratory: Negative for chest tightness, shortness of breath and wheezing.   Cardiovascular: Negative for chest pain and palpitations.  Gastrointestinal: Negative for abdominal pain, nausea and vomiting.       Objective    BP 132/80 (BP Location: Left Arm, Patient Position: Sitting, Cuff Size: Large)   Pulse 65   Temp 98.7 F (37.1 C)   Resp 20   Wt 231 lb 6.4 oz (105 kg)   BMI 33.20 kg/m     Physical Exam  General appearance: Mildly obese male, cooperative and in no acute distress Head: Normocephalic, without obvious abnormality, atraumatic Respiratory: Respirations even and unlabored, normal respiratory rate Extremities: All extremities are intact.  Skin: Skin color, texture, turgor normal. No rashes seen  Psych: Appropriate mood and affect. Neurologic: Mental status: Alert, oriented to person, place, and time, thought content appropriate.    Assessment & Plan     1. Essential (primary) hypertension Much better since doubling dose of lisinopril, refilled today.   - lisinopril (ZESTRIL) 20  MG tablet; Take 1 tablet (20 mg total) by mouth 2 (two) times daily.  Dispense: 180 tablet; Refill: 2  Follow up o.v. or CPE in 6 months.      The entirety of the information documented in the History of Present Illness, Review of Systems and Physical Exam were personally obtained by me. Portions of this information were initially documented by the CMA and reviewed by me for thoroughness and accuracy.      Lelon Huh, MD  [facility] [phone] (phone) [phone] (fax)  [place]

## 2021-01-26 NOTE — Patient Instructions (Signed)
.   Please review the attached list of medications and notify my office if there are any errors.   . Please bring all of your medications to every appointment so we can make sure that our medication list is the same as yours.   

## 2021-02-12 ENCOUNTER — Telehealth (INDEPENDENT_AMBULATORY_CARE_PROVIDER_SITE_OTHER): Payer: BC Managed Care – PPO | Admitting: Gastroenterology

## 2021-02-12 DIAGNOSIS — Z1211 Encounter for screening for malignant neoplasm of colon: Secondary | ICD-10-CM

## 2021-02-12 MED ORDER — PEG 3350-KCL-NA BICARB-NACL 420 G PO SOLR: 4000.0000 mL | Freq: Once | ORAL | 0 refills | Status: AC

## 2021-02-12 NOTE — Progress Notes (Signed)
Gastroenterology Pre-Procedure Review  Request Date: 02/22/21 Requesting Physician: Dr. Bonna Gains  PATIENT REVIEW QUESTIONS: The patient responded to the following health history questions as indicated:    1. Are you having any GI issues? no 2. Do you have a personal history of Polyps? no 3. Do you have a family history of Colon Cancer or Polyps? no 4. Diabetes Mellitus? no 5. Joint replacements in the past 12 months?no 6. Major health problems in the past 3 months?no 7. Any artificial heart valves, MVP, or defibrillator?no    MEDICATIONS & ALLERGIES:    Patient reports the following regarding taking any anticoagulation/antiplatelet therapy:   Plavix, Coumadin, Eliquis, Xarelto, Lovenox, Pradaxa, Brilinta, or Effient? no Aspirin? yes (aspirin 81 mg)  Patient confirms/reports the following medications:  Current Outpatient Medications  Medication Sig Dispense Refill   aspirin EC 81 MG tablet Take 81 mg by mouth daily. Swallow whole.     Doxylamine Succinate, Sleep, (SLEEP AID PO) Take by mouth. Nyquil     GLUCOSAMINE-CHONDROIT-VIT C-MN PO Take 1 tablet by mouth daily.     Krill Oil 300 MG CAPS Take 1 tablet by mouth daily.     lisinopril (ZESTRIL) 20 MG tablet Take 1 tablet (20 mg total) by mouth 2 (two) times daily. 180 tablet 2   MAGNESIUM PO Take 1 tablet by mouth daily.     MELATONIN PO Take by mouth.     Multiple Vitamins-Minerals (MULTIVITAMIN ADULT PO) Take 1 tablet by mouth daily.     Saw Palmetto, Serenoa repens, (SAW PALMETTO PO) Take by mouth.     TURMERIC PO Take by mouth.     No current facility-administered medications for this visit.    Patient confirms/reports the following allergies:  No Known Allergies  No orders of the defined types were placed in this encounter.   AUTHORIZATION INFORMATION Primary Insurance: 1D#: Group #:  Secondary Insurance: 1D#: Group #:  SCHEDULE INFORMATION: Date: 02/22/21 Time: Location: [place]

## 2021-02-19 ENCOUNTER — Encounter: Payer: Self-pay | Admitting: Gastroenterology

## 2021-02-22 ENCOUNTER — Encounter: Payer: Self-pay | Admitting: Gastroenterology

## 2021-02-22 ENCOUNTER — Ambulatory Visit: Payer: BC Managed Care – PPO | Admitting: Certified Registered"

## 2021-02-22 ENCOUNTER — Ambulatory Visit
Admission: RE | Admit: 2021-02-22 | Discharge: 2021-02-22 | Disposition: A | Discharge status: Home / Self Care | Payer: BC Managed Care – PPO | Attending: Gastroenterology | Admitting: Gastroenterology

## 2021-02-22 ENCOUNTER — Encounter
Admission: RE | Disposition: A | Discharge status: Home / Self Care | Payer: Self-pay | Source: Home / Self Care | Attending: Gastroenterology

## 2021-02-22 DIAGNOSIS — Z1211 Encounter for screening for malignant neoplasm of colon: Secondary | ICD-10-CM | POA: Diagnosis not present

## 2021-02-22 DIAGNOSIS — K635 Polyp of colon: Secondary | ICD-10-CM | POA: Diagnosis not present

## 2021-02-22 DIAGNOSIS — D125 Benign neoplasm of sigmoid colon: Secondary | ICD-10-CM | POA: Insufficient documentation

## 2021-02-22 DIAGNOSIS — Z7982 Long term (current) use of aspirin: Secondary | ICD-10-CM | POA: Diagnosis not present

## 2021-02-22 DIAGNOSIS — Z79899 Other long term (current) drug therapy: Secondary | ICD-10-CM | POA: Insufficient documentation

## 2021-02-22 DIAGNOSIS — Z87891 Personal history of nicotine dependence: Secondary | ICD-10-CM | POA: Insufficient documentation

## 2021-02-22 DIAGNOSIS — D124 Benign neoplasm of descending colon: Secondary | ICD-10-CM | POA: Insufficient documentation

## 2021-02-22 DIAGNOSIS — K648 Other hemorrhoids: Secondary | ICD-10-CM | POA: Insufficient documentation

## 2021-02-22 DIAGNOSIS — K5730 Diverticulosis of large intestine without perforation or abscess without bleeding: Secondary | ICD-10-CM | POA: Diagnosis not present

## 2021-02-22 HISTORY — PX: COLONOSCOPY WITH PROPOFOL: SHX5780

## 2021-02-22 SURGERY — COLONOSCOPY WITH PROPOFOL
Anesthesia: General

## 2021-02-22 MED ORDER — SODIUM CHLORIDE 0.9 % IV SOLN: INTRAVENOUS | Status: DC

## 2021-02-22 MED ORDER — PROPOFOL 500 MG/50ML IV EMUL
INTRAVENOUS | Status: AC
  Filled 2021-02-22: qty 50

## 2021-02-22 MED ORDER — PROPOFOL 10 MG/ML IV BOLUS
INTRAVENOUS | Status: DC | PRN
  Administered 2021-02-22: 100 mg via INTRAVENOUS

## 2021-02-22 MED ORDER — PROPOFOL 500 MG/50ML IV EMUL
INTRAVENOUS | Status: DC | PRN
  Administered 2021-02-22: 150 ug/kg/min via INTRAVENOUS

## 2021-02-22 NOTE — Anesthesia Preprocedure Evaluation (Signed)
Anesthesia Evaluation  Patient identified by MRN, date of birth, ID band Patient awake    Reviewed: Allergy & Precautions, NPO status , Patient's Chart, lab work & pertinent test results  Airway Mallampati: II  TM Distance: >3 FB Neck ROM: Full    Dental no notable dental hx. (+) Teeth Intact   Pulmonary neg pulmonary ROS, former smoker,    Pulmonary exam normal        Cardiovascular hypertension, Pt. on medications Normal cardiovascular exam     Neuro/Psych PSYCHIATRIC DISORDERS Anxiety negative neurological ROS     GI/Hepatic negative GI ROS, Neg liver ROS, Bowel prep,  Endo/Other  negative endocrine ROS  Renal/GU negative Renal ROS  negative genitourinary   Musculoskeletal negative musculoskeletal ROS (+)   Abdominal   Peds negative pediatric ROS (+)  Hematology negative hematology ROS (+)   Anesthesia Other Findings   Reproductive/Obstetrics negative OB ROS                            Anesthesia Physical Anesthesia Plan  ASA: 2  Anesthesia Plan: General   Post-op Pain Management:    Induction: Intravenous  PONV Risk Score and Plan: 2 and Propofol infusion and TIVA  Airway Management Planned: Natural Airway and Nasal Cannula  Additional Equipment:   Intra-op Plan:   Post-operative Plan:   Informed Consent: I have reviewed the patients History and Physical, chart, labs and discussed the procedure including the risks, benefits and alternatives for the proposed anesthesia with the patient or authorized representative who has indicated his/her understanding and acceptance.       Plan Discussed with: CRNA, Anesthesiologist and Surgeon  Anesthesia Plan Comments:         Anesthesia Quick Evaluation

## 2021-02-22 NOTE — H&P (Signed)
Vonda Antigua, MD [address] [place], [place], [place], [address] [address], [place], [place], [place], [address] Phone: [phone]  Fax: [phone]  Primary Care Physician:  Birdie Sons, MD   Pre-Procedure History & Physical: HPI:  Jared Mccann is a 65 y.o. male is here for a colonoscopy.   Past Medical History:  Diagnosis Date   History of chicken pox    Hypertriglyceridemia 09/18/2015   triglycerides > 500 in 2007     Past Surgical History:  Procedure Laterality Date   [place]    Prior to Admission medications   Medication Sig Start Date End Date Taking? Authorizing Provider  aspirin EC 81 MG tablet Take 81 mg by mouth daily. Swallow whole.   Yes [provider]  Doxylamine Succinate, Sleep, (SLEEP AID PO) Take by mouth. Nyquil   Yes [provider]  GLUCOSAMINE-CHONDROIT-VIT C-MN PO Take 1 tablet by mouth daily.   Yes [provider]  Javier Docker Oil 300 MG CAPS Take 1 tablet by mouth daily.   Yes [provider]  lisinopril (ZESTRIL) 20 MG tablet Take 1 tablet (20 mg total) by mouth 2 (two) times daily. 01/26/21  Yes Birdie Sons, MD  MAGNESIUM PO Take 1 tablet by mouth daily.   Yes [provider]  MELATONIN PO Take by mouth.   Yes [provider]  Multiple Vitamins-Minerals (MULTIVITAMIN ADULT PO) Take 1 tablet by mouth daily.   Yes [provider]  Saw Palmetto, Serenoa repens, (SAW PALMETTO PO) Take by mouth.   Yes [provider]  TURMERIC PO Take by mouth.   Yes [provider]    Allergies as of 02/12/2021   (No Known Allergies)    Family History  Problem Relation Age of Onset   Alzheimer's disease Father     Social History   Socioeconomic History   Marital status: Divorced    Spouse name: Not on file   Number of children: 1   Years of education: Not on file   Highest education level: Not on file  Occupational History    Occupation: Truck driver  Tobacco Use   Smoking status: Former    Packs/day: 0.50    Years: 10.00    Pack years: 5.00    Types: Cigarettes    Quit date: 09/06/1983    Years since quitting: 37.4   Smokeless tobacco: Current    Types: Snuff  Vaping Use   Vaping Use: Never used  Substance and Sexual Activity   Alcohol use: Yes    Alcohol/week: 0.0 standard drinks    Comment: occasional use   Drug use: No   Sexual activity: Not on file  Other Topics Concern   Not on file  Social History Narrative   Not on file   Social Determinants of Health   Financial Resource Strain: Not on file  Food Insecurity: Not on file  Transportation Needs: Not on file  Physical Activity: Not on file  Stress: Not on file  Social Connections: Not on file  Intimate Partner Violence: Not on file    Review of Systems: See HPI, otherwise negative ROS  Physical Exam: BP (!) 142/83   Pulse 75   Temp (!) 96.8 F (36 C) (Temporal)   Resp 18   Ht 5\' 10"  (1.778 m)   Wt 102.1 kg   SpO2 97%   BMI 32.28 kg/m  General:   Alert,  pleasant and cooperative in NAD Head:  Normocephalic and atraumatic. Neck:  Supple; no masses or thyromegaly. Lungs:  Clear throughout to auscultation, normal respiratory effort.    Heart:  +S1, +S2, Regular rate and rhythm, No edema. Abdomen:  Soft, nontender and nondistended. Normal bowel sounds, without guarding, and without rebound.   Neurologic:  Alert and  oriented x4;  grossly normal neurologically.  Impression/Plan: Jared Mccann is here for a colonoscopy to be performed for average risk screening.  Risks, benefits, limitations, and alternatives regarding  colonoscopy have been reviewed with the patient.  Questions have been answered.  All parties agreeable.   Virgel Manifold, MD  02/22/2021, 8:43 AM

## 2021-02-22 NOTE — Transfer of Care (Signed)
Immediate Anesthesia Transfer of Care Note  Patient: Jared Mccann  Procedure(s) Performed: COLONOSCOPY WITH PROPOFOL  Patient Location: PACU and Endoscopy Unit  Anesthesia Type:General  Level of Consciousness: drowsy  Airway & Oxygen Therapy: Patient Spontanous Breathing  Post-op Assessment: Report given to RN  Post vital signs: stable  Last Vitals:  Vitals Value Taken Time  BP    Temp    Pulse    Resp    SpO2      Last Pain:  Vitals:   02/22/21 0815  TempSrc: Temporal  PainSc: 0-No pain         Complications: No notable events documented.

## 2021-02-22 NOTE — Op Note (Signed)
[facility] Gastroenterology Patient Name: Jared Mccann Procedure Date: 02/22/2021 8:40 AM MRN: [id number] Account #: [id number] Date of Birth: [birth year] Admit Type: Outpatient Age: 65 Room: [facility] ENDO ROOM 2 Gender: Male Note Status: Finalized Procedure:             Colonoscopy Indications:           Screening for colorectal malignant neoplasm Providers:             Tierra Divelbiss B. Bonna Gains MD, MD Referring MD:          Kirstie Peri. Caryn Section, MD (Referring MD) Medicines:             Monitored Anesthesia Care Complications:         No immediate complications. Procedure:             Pre-Anesthesia Assessment:                        - ASA Grade Assessment: II - A patient with mild                         systemic disease.                        - Prior to the procedure, a History and Physical was                         performed, and patient medications, allergies and                         sensitivities were reviewed. The patient's tolerance                         of previous anesthesia was reviewed.                        - The risks and benefits of the procedure and the                         sedation options and risks were discussed with the                         patient. All questions were answered and informed                         consent was obtained.                        - Patient identification and proposed procedure were                         verified prior to the procedure by the physician, the                         nurse, the anesthesiologist, the anesthetist and the                         technician. The procedure was verified in the                         procedure  room.                        After obtaining informed consent, the colonoscope was                         passed under direct vision. Throughout the procedure,                         the patient's blood pressure, pulse, and oxygen                         saturations were  monitored continuously. The                         Colonoscope was introduced through the anus and                         advanced to the the cecum, identified by appendiceal                         orifice and ileocecal valve. The colonoscopy was                         performed with ease. The patient tolerated the                         procedure well. The quality of the bowel preparation                         was good. Findings:      The perianal and digital rectal examinations were normal.      A 5 mm polyp was found in the descending colon. The polyp was sessile.       The polyp was removed with a cold snare. Resection and retrieval were       complete.      A 10 mm polyp was found in the sigmoid colon. The polyp was       semi-pedunculated. The polyp was removed with a hot snare. Resection and       retrieval were complete. To prevent bleeding after the polypectomy, one       hemostatic clip was successfully placed. There was no bleeding at the       end of the procedure.      A 5 mm polyp was found in the sigmoid colon. The polyp was sessile. The       polyp was removed with a cold snare. Resection and retrieval were       complete.      Multiple diverticula were found in the sigmoid colon.      The exam was otherwise without abnormality.      The rectum, sigmoid colon, descending colon, transverse colon, ascending       colon and cecum appeared normal.      Non-bleeding internal hemorrhoids were found during retroflexion. Impression:            - One 5 mm polyp in the descending colon, removed with                         a cold snare. Resected and retrieved.                        -  One 10 mm polyp in the sigmoid colon, removed with a                         hot snare. Resected and retrieved. Clip was placed.                        - One 5 mm polyp in the sigmoid colon, removed with a                         cold snare. Resected and retrieved.                        -  Diverticulosis in the sigmoid colon.                        - The examination was otherwise normal.                        - The rectum, sigmoid colon, descending colon,                         transverse colon, ascending colon and cecum are normal.                        - Non-bleeding internal hemorrhoids. Recommendation:        - Discharge patient to home (with escort).                        - Advance diet as tolerated.                        - Continue present medications.                        - Await pathology results.                        - Repeat colonoscopy date to be determined after                         pending pathology results are reviewed.                        - The findings and recommendations were discussed with                         the patient.                        - The findings and recommendations were discussed with                         the patient's family.                        - Return to primary care physician as previously                         scheduled.                        -  High fiber diet. Procedure Code(s):     --- Professional ---                        [phone], Colonoscopy, flexible; with removal of                         tumor(s), polyp(s), or other lesion(s) by snare                         technique Diagnosis Code(s):     --- Professional ---                        Z12.11, Encounter for screening for malignant neoplasm                         of colon                        K63.5, Polyp of colon CPT copyright 2019 American Medical Association. All rights reserved. The codes documented in this report are preliminary and upon coder review may  be revised to meet current compliance requirements.  Vonda Antigua, MD Margretta Sidle B. Bonna Gains MD, MD 02/22/2021 9:29:19 AM This report has been signed electronically. Number of Addenda: 0 Note Initiated On: 02/22/2021 8:40 AM Scope Withdrawal Time: 0 hours 31 minutes 25 seconds  Total  Procedure Duration: 0 hours 35 minutes 45 seconds  Estimated Blood Loss:  Estimated blood loss: none.      [facility]

## 2021-02-22 NOTE — Anesthesia Postprocedure Evaluation (Signed)
Anesthesia Post Note  Patient: Jared Mccann  Procedure(s) Performed: COLONOSCOPY WITH PROPOFOL  Patient location during evaluation: Phase II Anesthesia Type: General Level of consciousness: awake and alert, awake and oriented Pain management: pain level controlled Vital Signs Assessment: post-procedure vital signs reviewed and stable Respiratory status: spontaneous breathing, nonlabored ventilation and respiratory function stable Cardiovascular status: blood pressure returned to baseline and stable Postop Assessment: no apparent nausea or vomiting Anesthetic complications: no   No notable events documented.   Last Vitals:  Vitals:   02/22/21 0928 02/22/21 0948  BP: (!) 144/93   Pulse: 63   Resp:    Temp: (!) 36.1 C   SpO2: 94% 96%    Last Pain:  Vitals:   02/22/21 0948  TempSrc:   PainSc: 0-No pain                 Phill Mutter

## 2021-02-23 ENCOUNTER — Encounter: Payer: Self-pay | Admitting: Gastroenterology

## 2021-02-23 LAB — SURGICAL PATHOLOGY

## 2021-02-24 ENCOUNTER — Encounter: Payer: Self-pay | Admitting: Family Medicine

## 2021-02-24 ENCOUNTER — Encounter: Payer: Self-pay | Admitting: Gastroenterology

## 2021-02-24 DIAGNOSIS — Z86010 Personal history of colonic polyps: Secondary | ICD-10-CM | POA: Insufficient documentation

## 2021-08-04 ENCOUNTER — Ambulatory Visit (INDEPENDENT_AMBULATORY_CARE_PROVIDER_SITE_OTHER): Payer: BC Managed Care – PPO | Admitting: Family Medicine

## 2021-08-04 ENCOUNTER — Encounter: Payer: Self-pay | Admitting: Family Medicine

## 2021-08-04 ENCOUNTER — Other Ambulatory Visit: Payer: Self-pay

## 2021-08-04 VITALS — BP 135/95 | HR 76 | Temp 98.7°F | Ht 70.0 in | Wt 236.0 lb

## 2021-08-04 DIAGNOSIS — I10 Essential (primary) hypertension: Secondary | ICD-10-CM | POA: Diagnosis not present

## 2021-08-04 DIAGNOSIS — Z23 Encounter for immunization: Secondary | ICD-10-CM

## 2021-08-04 DIAGNOSIS — Z1159 Encounter for screening for other viral diseases: Secondary | ICD-10-CM | POA: Diagnosis not present

## 2021-08-04 DIAGNOSIS — Z125 Encounter for screening for malignant neoplasm of prostate: Secondary | ICD-10-CM | POA: Diagnosis not present

## 2021-08-04 DIAGNOSIS — Z0000 Encounter for general adult medical examination without abnormal findings: Secondary | ICD-10-CM

## 2021-08-04 NOTE — Progress Notes (Signed)
Complete physical exam   Patient: Jared Mccann   DOB: 09-19-1955   65 y.o. Male  MRN: [id number] Visit Date: 08/04/2021  Today's healthcare provider: Lelon Huh, MD   Chief Complaint  Patient presents with   Annual Exam   Subjective    Jared Mccann is a 65 y.o. male who presents today for a complete physical exam.  He reports consuming a general diet. The patient does not participate in regular exercise at present. He generally feels well. He reports sleeping fairly well. He does not have additional problems to discuss today.  HPI  Hypertension, follow-up  BP Readings from Last 3 Encounters:  08/04/21 (!) 135/95  02/22/21 (!) 144/93  01/26/21 132/80   Wt Readings from Last 3 Encounters:  08/04/21 236 lb (107 kg)  02/22/21 225 lb (102.1 kg)  01/26/21 231 lb 6.4 oz (105 kg)     He was last seen for hypertension 6 months ago.  BP at that visit was 132/80. Management since that visit includes continuing same dose of Lisinopril.  He reports excellent compliance with treatment. He is not having side effects.  He is following a Regular diet. He is exercising. He does not smoke.  Use of agents associated with hypertension: NSAIDS.   Outside blood pressures are checked occasionally at home.  Pt states it is normal and sometimes high. Symptoms: No chest pain No chest pressure  No palpitations No syncope  No dyspnea No orthopnea  No paroxysmal nocturnal dyspnea No lower extremity edema   Pertinent labs: Lab Results  Component Value Date   CHOL 175 12/29/2020   HDL 49 12/29/2020   LDLCALC 103 (H) 12/29/2020   TRIG 130 12/29/2020   CHOLHDL 3.6 12/29/2020   Lab Results  Component Value Date   NA 137 01/22/2021   K 4.4 01/22/2021   CREATININE 1.5 (A) 01/22/2021   GFRNONAA 49 01/22/2021   GLUCOSE 78 12/29/2020   TSH 2.54 12/29/2020     The 10-year ASCVD risk score (Arnett DK, et al., 2019) is: 12.7%    ---------------------------------------------------------------------------------------------------   Past Medical History:  Diagnosis Date   History of chicken pox    Hypertriglyceridemia 09/18/2015   triglycerides > 500 in 2007    Past Surgical History:  Procedure Laterality Date   COLONOSCOPY WITH PROPOFOL N/A 02/22/2021   Procedure: COLONOSCOPY WITH PROPOFOL;  Surgeon: Virgel Manifold, MD;  Location: [facility] ENDOSCOPY;  Service: Endoscopy;  Laterality: N/A;   WISDOM TOOTH EXTRACTION  1983   Social History   Socioeconomic History   Marital status: Divorced    Spouse name: Not on file   Number of children: 1   Years of education: Not on file   Highest education level: Not on file  Occupational History   Occupation: Truck driver  Tobacco Use   Smoking status: Former    Packs/day: 0.50    Years: 10.00    Pack years: 5.00    Types: Cigarettes    Quit date: 09/06/1983    Years since quitting: 37.9   Smokeless tobacco: Current    Types: Snuff  Vaping Use   Vaping Use: Never used  Substance and Sexual Activity   Alcohol use: Yes    Alcohol/week: 0.0 standard drinks    Comment: occasional use   Drug use: No   Sexual activity: Not on file  Other Topics Concern   Not on file  Social History Narrative   Not on file   Social Determinants  of Health   Financial Resource Strain: Not on file  Food Insecurity: Not on file  Transportation Needs: Not on file  Physical Activity: Not on file  Stress: Not on file  Social Connections: Not on file  Intimate Partner Violence: Not on file   Family Status  Relation Name Status   Mother  Alive   Father  Deceased   Brother  Alive   Brother  Alive   Daughter  Alive   Neg Hx  (Not Specified)   Family History  Problem Relation Age of Onset   Atrial fibrillation Mother    Alzheimer's disease Father    Colon cancer Neg Hx    Prostate cancer Neg Hx    No Known Allergies  Patient Care Team: Birdie Sons, MD as PCP -  General (Family Medicine)   Medications: Outpatient Medications Prior to Visit  Medication Sig   Doxylamine Succinate, Sleep, (SLEEP AID PO) Take by mouth. Nyquil   GLUCOSAMINE-CHONDROIT-VIT C-MN PO Take 1 tablet by mouth daily.   Krill Oil 300 MG CAPS Take 1 tablet by mouth daily.   lisinopril (ZESTRIL) 20 MG tablet Take 1 tablet (20 mg total) by mouth 2 (two) times daily.   MAGNESIUM PO Take 1 tablet by mouth daily.   MELATONIN PO Take by mouth.   Multiple Vitamins-Minerals (MULTIVITAMIN ADULT PO) Take 1 tablet by mouth daily.   TURMERIC PO Take by mouth.   aspirin EC 81 MG tablet Take 81 mg by mouth daily. Swallow whole.   Saw Palmetto, Serenoa repens, (SAW PALMETTO PO) Take by mouth.   No facility-administered medications prior to visit.    Review of Systems  Constitutional: Negative.   HENT: Negative.    Eyes: Negative.   Respiratory: Negative.    Cardiovascular: Negative.   Gastrointestinal: Negative.   Endocrine: Negative.   Genitourinary: Negative.   Musculoskeletal: Negative.   Skin: Negative.   Allergic/Immunologic: Negative.   Neurological: Negative.   Hematological: Negative.   Psychiatric/Behavioral: Negative.       Objective    BP (!) 135/95 (BP Location: Right Arm, Patient Position: Sitting, Cuff Size: Large)   Pulse 76   Temp 98.7 F (37.1 C) (Oral)   Ht 5\' 10"  (1.778 m)   Wt 236 lb (107 kg)   SpO2 96%   BMI 33.86 kg/m    Physical Exam   General Appearance:    Mildly obese male. Alert, cooperative, in no acute distress, appears stated age  Head:    Normocephalic, without obvious abnormality, atraumatic  Eyes:    PERRL, conjunctiva/corneas clear, EOM's intact, fundi    benign, both eyes       Ears:    Normal TM's and external ear canals, both ears  Neck:   Supple, symmetrical, trachea midline, no adenopathy;       thyroid:  No enlargement/tenderness/nodules; no carotid   bruit or JVD  Back:     Symmetric, no curvature, ROM normal, no CVA  tenderness  Lungs:     Clear to auscultation bilaterally, respirations unlabored  Chest wall:    No tenderness or deformity  Heart:    Normal heart rate. Normal rhythm. No murmurs, rubs, or gallops.  S1 and S2 normal  Abdomen:     Soft, non-tender, bowel sounds active all four quadrants,    no masses, no organomegaly  Genitalia:    deferred  Rectal:    deferred  Extremities:   All extremities are intact. No cyanosis or edema  Pulses:   2+ and symmetric all extremities  Skin:   Skin color, texture, turgor normal, no rashes or lesions  Lymph nodes:   Cervical, supraclavicular, and axillary nodes normal  Neurologic:   CNII-XII intact. Normal strength, sensation and reflexes      throughout     Last depression screening scores PHQ 2/9 Scores 08/04/2021 12/29/2020 06/04/2018  PHQ - 2 Score 2 1 0  PHQ- 9 Score 2 1 2    Last fall risk screening Fall Risk  08/04/2021  Falls in the past year? 0  Number falls in past yr: 0  Injury with Fall? 0  Risk for fall due to : No Fall Risks  Follow up Falls evaluation completed   Last Audit-C alcohol use screening Alcohol Use Disorder Test (AUDIT) 08/04/2021  1. How often do you have a drink containing alcohol? 1  2. How many drinks containing alcohol do you have on a typical day when you are drinking? 0  3. How often do you have six or more drinks on one occasion? 0  AUDIT-C Score 1  Alcohol Brief Interventions/Follow-up -   A score of 3 or more in women, and 4 or more in men indicates increased risk for alcohol abuse, EXCEPT if all of the points are from question 1   No results found for any visits on 08/04/21.  Assessment & Plan    Routine Health Maintenance and Physical Exam  Exercise Activities and Dietary recommendations  Goals   None     Immunization History  Administered Date(s) Administered   Influenza,inj,Quad PF,6+ Mos 09/21/2015, 06/04/2018   Janssen (J&J) SARS-COV-2 Vaccination 03/09/2020   Tdap 09/21/2015    Health  Maintenance  Topic Date Due   HIV Screening  Never done   Hepatitis C Screening  Never done   Zoster Vaccines- Shingrix (1 of 2) Never done   COVID-19 Vaccine (2 - Booster for Janssen series) 05/04/2020   INFLUENZA VACCINE  04/05/2021   COLONOSCOPY (Pts 45-32yrs Insurance coverage will need to be confirmed)  02/23/2024   TETANUS/TDAP  09/20/2025   Pneumococcal Vaccine 57-50 Years old  Aged Out   HPV VACCINES  Aged Out    Discussed health benefits of physical activity, and encouraged him to engage in regular exercise appropriate for his age and condition.  1. Need for influenza vaccination  - Flu Vaccine QUAD 6+ mos PF IM (Fluarix Quad PF)  2. Need for hepatitis C screening test  - Hepatitis C antibody  3. Prostate cancer screening   - PSA, Total with Reflex to PSA, Free ([facility] only)  4. Need for shingles vaccine  - Administer Zoster, Recombinant (Shingrix) Vaccine #1  5. Primary hypertension Doing well on 40mg  daily lisinopril. A little high today since he only took 1 tablet yesterday.   - EKG 12-Lead - Comprehensive metabolic panel - CBC     The entirety of the information documented in the History of Present Illness, Review of Systems and Physical Exam were personally obtained by me. Portions of this information were initially documented by the CMA and reviewed by me for thoroughness and accuracy.     Lelon Huh, MD  [facility] [phone] (phone) [phone] (fax)  [place]

## 2021-08-04 NOTE — Patient Instructions (Addendum)
Please review the attached list of medications and notify my office if there are any errors.   It is recommended to engage in 150 minutes of moderate exercise every week.    The CDC recommends two doses of Shingrix (the shingles vaccine) separated by 2 to 6 months for adults age 62 years and older. I recommend checking with your insurance plan regarding coverage for this vaccine.

## 2021-08-05 DIAGNOSIS — Z1159 Encounter for screening for other viral diseases: Secondary | ICD-10-CM | POA: Diagnosis not present

## 2021-08-05 DIAGNOSIS — I10 Essential (primary) hypertension: Secondary | ICD-10-CM | POA: Diagnosis not present

## 2021-08-05 DIAGNOSIS — Z125 Encounter for screening for malignant neoplasm of prostate: Secondary | ICD-10-CM | POA: Diagnosis not present

## 2021-08-06 LAB — CBC
HCT: 52.3 % — ABNORMAL HIGH (ref 38.5–50.0)
Hemoglobin: 18.4 g/dL — ABNORMAL HIGH (ref 13.2–17.1)
MCH: 30.1 pg (ref 27.0–33.0)
MCHC: 35.2 g/dL (ref 32.0–36.0)
MCV: 85.5 fL (ref 80.0–100.0)
MPV: 9.6 fL (ref 7.5–12.5)
Platelets: 198 10*3/uL (ref 140–400)
RBC: 6.12 10*6/uL — ABNORMAL HIGH (ref 4.20–5.80)
RDW: 12.7 % (ref 11.0–15.0)
WBC: 7.2 10*3/uL (ref 3.8–10.8)

## 2021-08-06 LAB — COMPREHENSIVE METABOLIC PANEL
AG Ratio: 2 (calc) (ref 1.0–2.5)
ALT: 20 U/L (ref 9–46)
AST: 19 U/L (ref 10–35)
Albumin: 4.3 g/dL (ref 3.6–5.1)
Alkaline phosphatase (APISO): 61 U/L (ref 35–144)
BUN/Creatinine Ratio: 12 (calc) (ref 6–22)
BUN: 17 mg/dL (ref 7–25)
CO2: 27 mmol/L (ref 20–32)
Calcium: 8.9 mg/dL (ref 8.6–10.3)
Chloride: 103 mmol/L (ref 98–110)
Creat: 1.38 mg/dL — ABNORMAL HIGH (ref 0.70–1.35)
Globulin: 2.2 g/dL (calc) (ref 1.9–3.7)
Glucose, Bld: 74 mg/dL (ref 65–99)
Potassium: 5.1 mmol/L (ref 3.5–5.3)
Sodium: 139 mmol/L (ref 135–146)
Total Bilirubin: 1.6 mg/dL — ABNORMAL HIGH (ref 0.2–1.2)
Total Protein: 6.5 g/dL (ref 6.1–8.1)

## 2021-08-06 LAB — HEPATITIS C ANTIBODY
Hepatitis C Ab: NONREACTIVE
SIGNAL TO CUT-OFF: 0.03 (ref <1.00)

## 2021-08-06 LAB — PSA, TOTAL WITH REFLEX TO PSA, FREE: PSA, Total: 1.1 ng/mL (ref <=4.0)

## 2021-10-18 ENCOUNTER — Encounter: Payer: Self-pay | Admitting: Family Medicine

## 2021-10-24 ENCOUNTER — Other Ambulatory Visit: Payer: Self-pay | Admitting: Family Medicine

## 2021-10-24 DIAGNOSIS — I10 Essential (primary) hypertension: Secondary | ICD-10-CM

## 2021-11-12 NOTE — Progress Notes (Signed)
?Jared Mccann D.O. ?[facility] ?[place] ?Phone: [phone] ?Subjective:   ?I, Jared Mccann, am serving as a scribe for Dr. Hulan Saas. ? ?This visit occurred during the SARS-CoV-2 public health emergency.  Safety protocols were in place, including screening questions prior to the visit, additional usage of staff PPE, and extensive cleaning of exam room while observing appropriate contact time as indicated for disinfecting solutions.  ? ? ?I'm seeing this patient by the request  of:  Birdie Sons, MD ? ?CC: Right shoulder pain ? ?[id number]  ?Jared Mccann is a 66 y.o. male coming in with complaint of R shoulder pain. Patient states that he believes he injured shoulder 2 years ago. Patient is able to workout but has had continued pain. 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 Pain with extension. Pain is in back of shoulder. Denies any radiating symptoms. Has not tried many modalities.  ? ? ? ?  ? ?Past Medical History:  ?Diagnosis Date  ? History of chicken pox   ? Hypertriglyceridemia 09/18/2015  ? triglycerides > 500 in 2007   ? ?Past Surgical History:  ?Procedure Laterality Date  ? COLONOSCOPY WITH PROPOFOL N/A 02/22/2021  ? Procedure: COLONOSCOPY WITH PROPOFOL;  Surgeon: Virgel Manifold, MD;  Location: [facility] ENDOSCOPY;  Service: Endoscopy;  Laterality: N/A;  ? [place]  ? ?Social History  ? ?Socioeconomic History  ? Marital status: Divorced  ?  Spouse name: Not on file  ? Number of children: 1  ? Years of education: Not on file  ? Highest education level: Not on file  ?Occupational History  ? Occupation: Truck Geophysicist/field seismologist  ?Tobacco Use  ? Smoking status: Former  ?  Packs/day: 0.50  ?  Years: 10.00  ?  Pack years: 5.00  ?  Types: Cigarettes  ?  Quit date: 09/06/1983  ?  Years since quitting: 38.2  ? Smokeless tobacco: Current  ?  Types: Snuff  ?Vaping Use  ? Vaping Use: Never used   ?Substance and Sexual Activity  ? Alcohol use: Yes  ?  Alcohol/week: 0.0 standard drinks  ?  Comment: occasional use  ? Drug use: No  ? Sexual activity: Not on file  ?Other Topics Concern  ? Not on file  ?Social History Narrative  ? Not on file  ? ?Social Determinants of Health  ? ?Financial Resource Strain: Not on file  ?Food Insecurity: Not on file  ?Transportation Needs: Not on file  ?Physical Activity: Not on file  ?Stress: Not on file  ?Social Connections: Not on file  ? ?No Known Allergies ?Family History  ?Problem Relation Age of Onset  ? Atrial fibrillation Mother   ? Alzheimer's disease Father   ? Colon cancer Neg Hx   ? Prostate cancer Neg Hx   ? ? ? ?Current Outpatient Medications (Cardiovascular):  ?  lisinopril (ZESTRIL) 20 MG tablet, TAKE 1 TABLET(20 MG) BY MOUTH TWICE DAILY ? ? ? ? ?Current Outpatient Medications (Other):  ?  Doxylamine Succinate, Sleep, (SLEEP AID PO), Take by mouth. Nyquil ?  GLUCOSAMINE-CHONDROIT-VIT C-MN PO, Take 1 tablet by mouth daily. ?  Krill Oil 300 MG CAPS, Take 1 tablet by mouth daily. ?  MAGNESIUM PO, Take 1 tablet by mouth daily. ?  MELATONIN PO, Take by mouth. ?  Multiple Vitamins-Minerals (MULTIVITAMIN ADULT PO), Take 1 tablet by mouth daily. ?  [employer],  Serenoa repens, (SAW PALMETTO PO), Take by mouth. ?  TURMERIC PO, Take by mouth. ? ? ?Reviewed prior external information including notes and imaging from  ?primary care provider ?As well as notes that were available from care everywhere and other healthcare systems. ? ?Past medical history, social, surgical and family history all reviewed in electronic medical record.  No pertanent information unless stated regarding to the chief complaint.  ? ?Review of Systems: ? No headache, visual changes, nausea, vomiting, diarrhea, constipation, dizziness, abdominal pain, skin rash, fevers, chills, night sweats, weight loss, swollen lymph nodes, body aches, joint swelling, chest pain, shortness of breath, mood changes.  POSITIVE muscle aches ? ?Objective  ?Blood pressure (!) 140/92, pulse 78, height '5\' 10"'$  (1.778 m), weight 226 lb (102.5 kg), SpO2 97 %. ?  ?General: No apparent distress alert and oriented x3 mood and affect normal, dressed appropriately.  ?HEENT: Pupils equal, extraocular movements intact  ?Respiratory: Patient's speak in full sentences and does not appear short of breath  ?Cardiovascular: No lower extremity edema, non tender, no erythema  ?Gait normal with good balance and coordination.  ?MSK: Right shoulder exam does have some mild limited range of motion and external range of motion.  Patient does have positive impingement with positive crossover.  Patient does have discomfort overall to palpation as well.  5 out of 5 strength of the rotator cuff ? ?Limited muscular skeletal ultrasound was performed and interpreted by Hulan Saas, M  ?Limited ultrasound shows of the patient does have hypoechoic changes of the bicep tendon sheath noted but no true acute tear appreciated.  Patient though does have some interstitial tearing it appears of the supraspinatus.  No significant retraction noted.  Acromioclavicular joint does have arthritic changes some mild hypoechoic changes that is consistent with an effusion.  Posterior labrum does have calcific changes noted with mild hypoechoic changes seen in the glenohumeral joint consistent with an effusion. ?Impression: Mild effusion of the glenohumeral and acromioclavicular joint with mild underlying arthritic changes and interstitial tearing of the supraspinatus ? ?97110; 15 additional minutes spent for Therapeutic exercises as stated in above notes.  This included exercises focusing on stretching, strengthening, with significant focus on eccentric aspects.   Long term goals include an improvement in range of motion, strength, endurance as well as avoiding reinjury. Patient's frequency would include in 1-2 times a day, 3-5 times a week for a duration of 6-12 weeks. Shoulder  Exercises that included:  ?Basic scapular stabilization to include adduction and depression of scapula ?Scaption, focusing on proper movement and good control ?Internal and External rotation utilizing a theraband, with elbow tucked at side entire time ?Rows with theraband  ? Proper technique shown and discussed handout in great detail with ATC.  All questions were discussed and answered.  ? ? ?  ?Impression and Recommendations:  ?  ? ?The above documentation has been reviewed and is accurate and complete Lyndal Pulley, DO ? ? ? ?

## 2021-11-15 ENCOUNTER — Ambulatory Visit (INDEPENDENT_AMBULATORY_CARE_PROVIDER_SITE_OTHER): Payer: BC Managed Care – PPO

## 2021-11-15 ENCOUNTER — Other Ambulatory Visit: Payer: Self-pay

## 2021-11-15 ENCOUNTER — Encounter: Payer: Self-pay | Admitting: Family Medicine

## 2021-11-15 ENCOUNTER — Ambulatory Visit (INDEPENDENT_AMBULATORY_CARE_PROVIDER_SITE_OTHER): Payer: BC Managed Care – PPO | Admitting: Family Medicine

## 2021-11-15 ENCOUNTER — Ambulatory Visit: Payer: Self-pay

## 2021-11-15 VITALS — BP 140/92 | HR 78 | Ht 70.0 in | Wt 226.0 lb

## 2021-11-15 DIAGNOSIS — M25511 Pain in right shoulder: Secondary | ICD-10-CM | POA: Diagnosis not present

## 2021-11-15 DIAGNOSIS — M19011 Primary osteoarthritis, right shoulder: Secondary | ICD-10-CM | POA: Diagnosis not present

## 2021-11-15 DIAGNOSIS — G8929 Other chronic pain: Secondary | ICD-10-CM | POA: Diagnosis not present

## 2021-11-15 NOTE — Assessment & Plan Note (Signed)
I do believe the patient does have some degenerative changes with interstitial tearing noted of the supraspinatus but no significant retraction.  Hypoechoic changes of the bicep tendon and the glenohumeral joint that does make me somewhat concerned for a labral tear.  Patient also has hypoechoic changes on ultrasound of the acromioclavicular joint that we will monitor as well.  Patient wants to try conservative therapy.  X-rays are pending.  Discussed over-the-counter medications, work with Product/process development scientist.  Worsening pain consider formal physical therapy and injections at follow-up in 6 weeks ?

## 2021-11-15 NOTE — Patient Instructions (Addendum)
Xray today ?Exercises AC joint ?Voltaren gel ?Tart Cherry Extract '1200mg'$  at night ?Hands in peripheral vision ?See me in 6 weeks ?

## 2021-11-30 ENCOUNTER — Other Ambulatory Visit: Payer: Self-pay

## 2021-11-30 ENCOUNTER — Ambulatory Visit (INDEPENDENT_AMBULATORY_CARE_PROVIDER_SITE_OTHER): Payer: BC Managed Care – PPO | Admitting: Family Medicine

## 2021-11-30 DIAGNOSIS — Z23 Encounter for immunization: Secondary | ICD-10-CM

## 2021-12-09 NOTE — Progress Notes (Signed)
Vaccine administration only. No E&M service today.   

## 2021-12-23 NOTE — Progress Notes (Deleted)
[place] Phone: [phone] Subjective:    I'm seeing this patient by the request  of:  Birdie Sons, MD  CC:   [id number]  11/15/2021 I do believe the patient does have some degenerative changes with interstitial tearing noted of the supraspinatus but no significant retraction.  Hypoechoic changes of the bicep tendon and the glenohumeral joint that does make me somewhat concerned for a labral tear.  Patient also has hypoechoic changes on ultrasound of the acromioclavicular joint that we will monitor as well.  Patient wants to try conservative therapy.  X-rays are pending.  Discussed over-the-counter medications, work with Product/process development scientist.  Worsening pain consider formal physical therapy and injections at follow-up in 6 weeks  Update 12/27/2021 DELFIN Mccann is a 66 y.o. male coming in with complaint of R shoulder pain. Patient states       Past Medical History:  Diagnosis Date   History of chicken pox    Hypertriglyceridemia 09/18/2015   triglycerides > 500 in 2007    Past Surgical History:  Procedure Laterality Date   COLONOSCOPY WITH PROPOFOL N/A 02/22/2021   Procedure: COLONOSCOPY WITH PROPOFOL;  Surgeon: Virgel Manifold, MD;  Location: [facility] ENDOSCOPY;  Service: Endoscopy;  Laterality: N/A;   WISDOM TOOTH EXTRACTION  1983   Social History   Socioeconomic History   Marital status: Divorced    Spouse name: Not on file   Number of children: 1   Years of education: Not on file   Highest education level: Not on file  Occupational History   Occupation: Truck driver  Tobacco Use   Smoking status: Former    Packs/day: 0.50    Years: 10.00    Pack years: 5.00    Types: Cigarettes    Quit date: 09/06/1983    Years since quitting: 38.3   Smokeless tobacco: Current    Types: Snuff  Vaping Use   Vaping Use: Never used  Substance and Sexual Activity   Alcohol use: Yes    Alcohol/week: 0.0  standard drinks    Comment: occasional use   Drug use: No   Sexual activity: Not on file  Other Topics Concern   Not on file  Social History Narrative   Not on file   Social Determinants of Health   Financial Resource Strain: Not on file  Food Insecurity: Not on file  Transportation Needs: Not on file  Physical Activity: Not on file  Stress: Not on file  Social Connections: Not on file   No Known Allergies Family History  Problem Relation Age of Onset   Atrial fibrillation Mother    Alzheimer's disease Father    Colon cancer Neg Hx    Prostate cancer Neg Hx      Current Outpatient Medications (Cardiovascular):    lisinopril (ZESTRIL) 20 MG tablet, TAKE 1 TABLET(20 MG) BY MOUTH TWICE DAILY     Current Outpatient Medications (Other):    Doxylamine Succinate, Sleep, (SLEEP AID PO), Take by mouth. Nyquil   GLUCOSAMINE-CHONDROIT-VIT C-MN PO, Take 1 tablet by mouth daily.   Krill Oil 300 MG CAPS, Take 1 tablet by mouth daily.   MAGNESIUM PO, Take 1 tablet by mouth daily.   MELATONIN PO, Take by mouth.   Multiple Vitamins-Minerals (MULTIVITAMIN ADULT PO), Take 1 tablet by mouth daily.   Saw Palmetto, Serenoa repens, (SAW PALMETTO PO), Take by mouth.   TURMERIC PO, Take by mouth.   Reviewed prior  external information including notes and imaging from  primary care provider As well as notes that were available from care everywhere and other healthcare systems.  Past medical history, social, surgical and family history all reviewed in electronic medical record.  No pertanent information unless stated regarding to the chief complaint.   Review of Systems:  No headache, visual changes, nausea, vomiting, diarrhea, constipation, dizziness, abdominal pain, skin rash, fevers, chills, night sweats, weight loss, swollen lymph nodes, body aches, joint swelling, chest pain, shortness of breath, mood changes. POSITIVE muscle aches  Objective  There were no vitals taken for this  visit.   General: No apparent distress alert and oriented x3 mood and affect normal, dressed appropriately.  HEENT: Pupils equal, extraocular movements intact  Respiratory: Patient's speak in full sentences and does not appear short of breath  Cardiovascular: No lower extremity edema, non tender, no erythema  Gait normal with good balance and coordination.  MSK:  Non tender with full range of motion and good stability and symmetric strength and tone of shoulders, elbows, wrist, hip, knee and ankles bilaterally.     Impression and Recommendations:     The above documentation has been reviewed and is accurate and complete [name]

## 2021-12-27 ENCOUNTER — Ambulatory Visit: Payer: BC Managed Care – PPO | Admitting: Family Medicine

## 2022-02-22 ENCOUNTER — Encounter: Payer: Self-pay | Admitting: Family Medicine

## 2022-02-22 DIAGNOSIS — D751 Secondary polycythemia: Secondary | ICD-10-CM

## 2022-07-25 ENCOUNTER — Encounter: Payer: Self-pay | Admitting: Family Medicine

## 2022-07-25 DIAGNOSIS — D751 Secondary polycythemia: Secondary | ICD-10-CM | POA: Insufficient documentation

## 2022-11-16 ENCOUNTER — Other Ambulatory Visit: Payer: Self-pay | Admitting: Family Medicine

## 2022-11-16 DIAGNOSIS — I10 Essential (primary) hypertension: Secondary | ICD-10-CM

## 2022-11-16 NOTE — Telephone Encounter (Signed)
Patient needs OV, will refill for 30 days until OV can be made. OV  need for additional refills.  Requested Prescriptions  Pending Prescriptions Disp Refills   lisinopril (ZESTRIL) 20 MG tablet [Pharmacy Med Name: LISINOPRIL '20MG'$  TABLETS] 30 tablet 0    Sig: TAKE 1 TABLET(20 MG) BY MOUTH TWICE DAILY     Cardiovascular:  ACE Inhibitors Failed - 11/16/2022  3:43 AM      Failed - Cr in normal range and within 180 days    Creat  Date Value Ref Range Status  08/05/2021 1.38 (H) 0.70 - 1.35 mg/dL Final         Failed - K in normal range and within 180 days    Potassium  Date Value Ref Range Status  08/05/2021 5.1 3.5 - 5.3 mmol/L Final  04/29/2013 4.4 3.5 - 5.1 mmol/L Final         Failed - Last BP in normal range    BP Readings from Last 1 Encounters:  11/15/21 (!) 140/92         Failed - Valid encounter within last 6 months    Recent Outpatient Visits           11 months ago Need for shingles vaccine   [facility] Birdie Sons, MD   1 year ago Annual physical exam   [facility] Birdie Sons, MD   1 year ago Essential (primary) hypertension   [facility] Birdie Sons, MD   1 year ago Essential (primary) hypertension   [place] Birdie Sons, MD   4 years ago Essential (primary) hypertension   [place], Donald E, MD              Passed - Patient is not pregnant

## 2022-12-05 ENCOUNTER — Ambulatory Visit (INDEPENDENT_AMBULATORY_CARE_PROVIDER_SITE_OTHER): Payer: BC Managed Care – PPO | Admitting: Family Medicine

## 2022-12-05 ENCOUNTER — Encounter: Payer: Self-pay | Admitting: Family Medicine

## 2022-12-05 VITALS — BP 132/78 | HR 75 | Temp 97.9°F | Wt 221.0 lb

## 2022-12-05 DIAGNOSIS — E349 Endocrine disorder, unspecified: Secondary | ICD-10-CM

## 2022-12-05 DIAGNOSIS — I10 Essential (primary) hypertension: Secondary | ICD-10-CM | POA: Diagnosis not present

## 2022-12-05 DIAGNOSIS — Z125 Encounter for screening for malignant neoplasm of prostate: Secondary | ICD-10-CM | POA: Diagnosis not present

## 2022-12-05 DIAGNOSIS — D751 Secondary polycythemia: Secondary | ICD-10-CM | POA: Diagnosis not present

## 2022-12-05 NOTE — Progress Notes (Signed)
[place],acting as a scribe for Lelon Huh, MD.,have documented all relevant documentation on the behalf of Lelon Huh, MD,as directed by  Lelon Huh, MD while in the presence of Lelon Huh, MD.     Established patient visit   Patient: Jared Mccann   DOB: 07-18-1956   67 y.o. Male  MRN: [id number] Visit Date: 12/05/2022  Today's healthcare provider: Lelon Huh, MD    Subjective    HPI  Hypertension, follow-up  BP Readings from Last 3 Encounters:  12/05/22 132/78  11/15/21 (!) 140/92  08/04/21 (!) 135/95   Wt Readings from Last 3 Encounters:  12/05/22 221 lb (100.2 kg)  11/15/21 226 lb (102.5 kg)  08/04/21 236 lb (107 kg)     He was last seen for hypertension 1.5 years ago.  BP at that visit was as above. Management since that visit includes no known changes.  He reports good compliance with treatment. He is not having side effects.  He is following a  low carb  diet. He is not exercising. He does not smoke.  Use of agents associated with hypertension:  Energy drinks .   Outside blood pressures are not being checked. Symptoms: No chest pain No chest pressure  No palpitations No syncope  No dyspnea No orthopnea  No paroxysmal nocturnal dyspnea No lower extremity edema   Pertinent labs Lab Results  Component Value Date   CHOL 175 12/29/2020   HDL 49 12/29/2020   LDLCALC 103 (H) 12/29/2020   TRIG 130 12/29/2020   CHOLHDL 3.6 12/29/2020   Lab Results  Component Value Date   NA 139 08/05/2021   K 5.1 08/05/2021   CREATININE 1.38 (H) 08/05/2021   GFRNONAA 49 01/22/2021   GLUCOSE 74 08/05/2021   TSH 2.54 12/29/2020     The 10-year ASCVD risk score (Arnett DK, et al., 2019) is: 13.3%  ---------------------------------------------------------------------------------------------------  He would also like to have testosterone checked. He reports that he was previously treated for low testosterone at [facility] with injected testosterone. He has since switched to several OTC testosterone boosters, but states he felt better on injectable.   Medications: Outpatient Medications Prior to Visit  Medication Sig   Doxylamine Succinate, Sleep, (SLEEP AID PO) Take by mouth. Nyquil   GLUCOSAMINE-CHONDROIT-VIT C-MN PO Take 1 tablet by mouth daily.   Krill Oil 300 MG CAPS Take 1 tablet by mouth daily.   lisinopril (ZESTRIL) 20 MG tablet TAKE 1 TABLET(20 MG) BY MOUTH TWICE DAILY   MAGNESIUM PO Take 1 tablet by mouth daily.   MELATONIN PO Take by mouth.   Multiple Vitamins-Minerals (MULTIVITAMIN ADULT PO) Take 1 tablet by mouth daily.   Saw Palmetto, Serenoa repens, (SAW PALMETTO PO) Take by mouth.   TURMERIC PO Take by mouth.   No facility-administered medications prior to visit.    Review of Systems  Constitutional:  Negative for appetite change, chills and fever.  Respiratory:  Negative for chest tightness, shortness of breath and wheezing.   Cardiovascular:  Negative for chest pain and palpitations.  Gastrointestinal:  Negative for abdominal pain, nausea and vomiting.        Objective    BP 132/78 (BP Location: Left Arm, Patient Position: Sitting, Cuff Size: Large)   Pulse 75   Temp 97.9 F (36.6 C) (Oral)   Wt 221 lb (100.2 kg)   SpO2 97%   BMI 31.71 kg/m   Physical Exam   General: Appearance:  Overweight male in no acute distress  Eyes:    PERRL, conjunctiva/corneas clear, EOM's intact       Lungs:     Clear to auscultation bilaterally, respirations unlabored  Heart:    Normal heart rate. Normal rhythm. No murmurs, rubs, or gallops.    MS:   All extremities are intact.    Neurologic:   Awake, alert, oriented x 3. No apparent focal neurological defect.         Assessment & Plan     1. Primary hypertension Fairly well controlled. Needs to exercise more.  - Comprehensive metabolic panel - Lipid panel (is not fasting)  2. Hypotestosteronemia Previously treated with IM  testosterone at [facility] in [facility].    - Testosterone,Free and Total  3. Polycythemia  - CBC  4. Prostate cancer screening  - PSA Total (Reflex To Free) ([facility] only)      The entirety of the information documented in the History of Present Illness, Review of Systems and Physical Exam were personally obtained by me. Portions of this information were initially documented by the CMA and reviewed by me for thoroughness and accuracy.     Lelon Huh, MD  [place] [phone] (phone) [phone] (fax)  [place]

## 2022-12-07 LAB — COMPREHENSIVE METABOLIC PANEL
ALT: 22 IU/L (ref 0–44)
AST: 22 IU/L (ref 0–40)
Albumin/Globulin Ratio: 2.4 — ABNORMAL HIGH (ref 1.2–2.2)
Albumin: 4.3 g/dL (ref 3.9–4.9)
Alkaline Phosphatase: 84 IU/L (ref 44–121)
BUN/Creatinine Ratio: 13 (ref 10–24)
BUN: 20 mg/dL (ref 8–27)
Bilirubin Total: 0.3 mg/dL (ref 0.0–1.2)
CO2: 20 mmol/L (ref 20–29)
Calcium: 9.5 mg/dL (ref 8.6–10.2)
Chloride: 106 mmol/L (ref 96–106)
Creatinine, Ser: 1.55 mg/dL — ABNORMAL HIGH (ref 0.76–1.27)
Globulin, Total: 1.8 g/dL (ref 1.5–4.5)
Glucose: 98 mg/dL (ref 70–99)
Potassium: 4.4 mmol/L (ref 3.5–5.2)
Sodium: 139 mmol/L (ref 134–144)
Total Protein: 6.1 g/dL (ref 6.0–8.5)
eGFR: 50 mL/min/{1.73_m2} — ABNORMAL LOW (ref >59)

## 2022-12-07 LAB — LIPID PANEL
Chol/HDL Ratio: 3.5 ratio (ref 0.0–5.0)
Cholesterol, Total: 159 mg/dL (ref 100–199)
HDL: 46 mg/dL (ref >39)
LDL Chol Calc (NIH): 102 mg/dL — ABNORMAL HIGH (ref 0–99)
Triglycerides: 56 mg/dL (ref 0–149)
VLDL Cholesterol Cal: 11 mg/dL (ref 5–40)

## 2022-12-07 LAB — CBC
Hematocrit: 47.6 % (ref 37.5–51.0)
Hemoglobin: 16.3 g/dL (ref 13.0–17.7)
MCH: 29.6 pg (ref 26.6–33.0)
MCHC: 34.2 g/dL (ref 31.5–35.7)
MCV: 86 fL (ref 79–97)
Platelets: 236 10*3/uL (ref 150–450)
RBC: 5.51 x10E6/uL (ref 4.14–5.80)
RDW: 12.2 % (ref 11.6–15.4)
WBC: 7 10*3/uL (ref 3.4–10.8)

## 2022-12-07 LAB — TESTOSTERONE,FREE AND TOTAL
Testosterone, Free: 4.8 pg/mL — ABNORMAL LOW (ref 6.6–18.1)
Testosterone: 493 ng/dL (ref 264–916)

## 2022-12-07 LAB — PSA TOTAL (REFLEX TO FREE): Prostate Specific Ag, Serum: 1.2 ng/mL (ref 0.0–4.0)

## 2022-12-13 ENCOUNTER — Other Ambulatory Visit: Payer: Self-pay | Admitting: Family Medicine

## 2022-12-13 DIAGNOSIS — I10 Essential (primary) hypertension: Secondary | ICD-10-CM

## 2022-12-18 ENCOUNTER — Other Ambulatory Visit: Payer: Self-pay | Admitting: Family Medicine

## 2022-12-18 DIAGNOSIS — I10 Essential (primary) hypertension: Secondary | ICD-10-CM

## 2023-05-01 IMAGING — DX DG SHOULDER 2+V*R*
3 series · 3 of 3 positions shown · non-contrast
Comparison: 02/02/2017

CLINICAL DATA: Right shoulder pain, remote lifting injury

EXAM:
RIGHT SHOULDER - 2+ VIEW

[shoulder ap (1 of 2)]
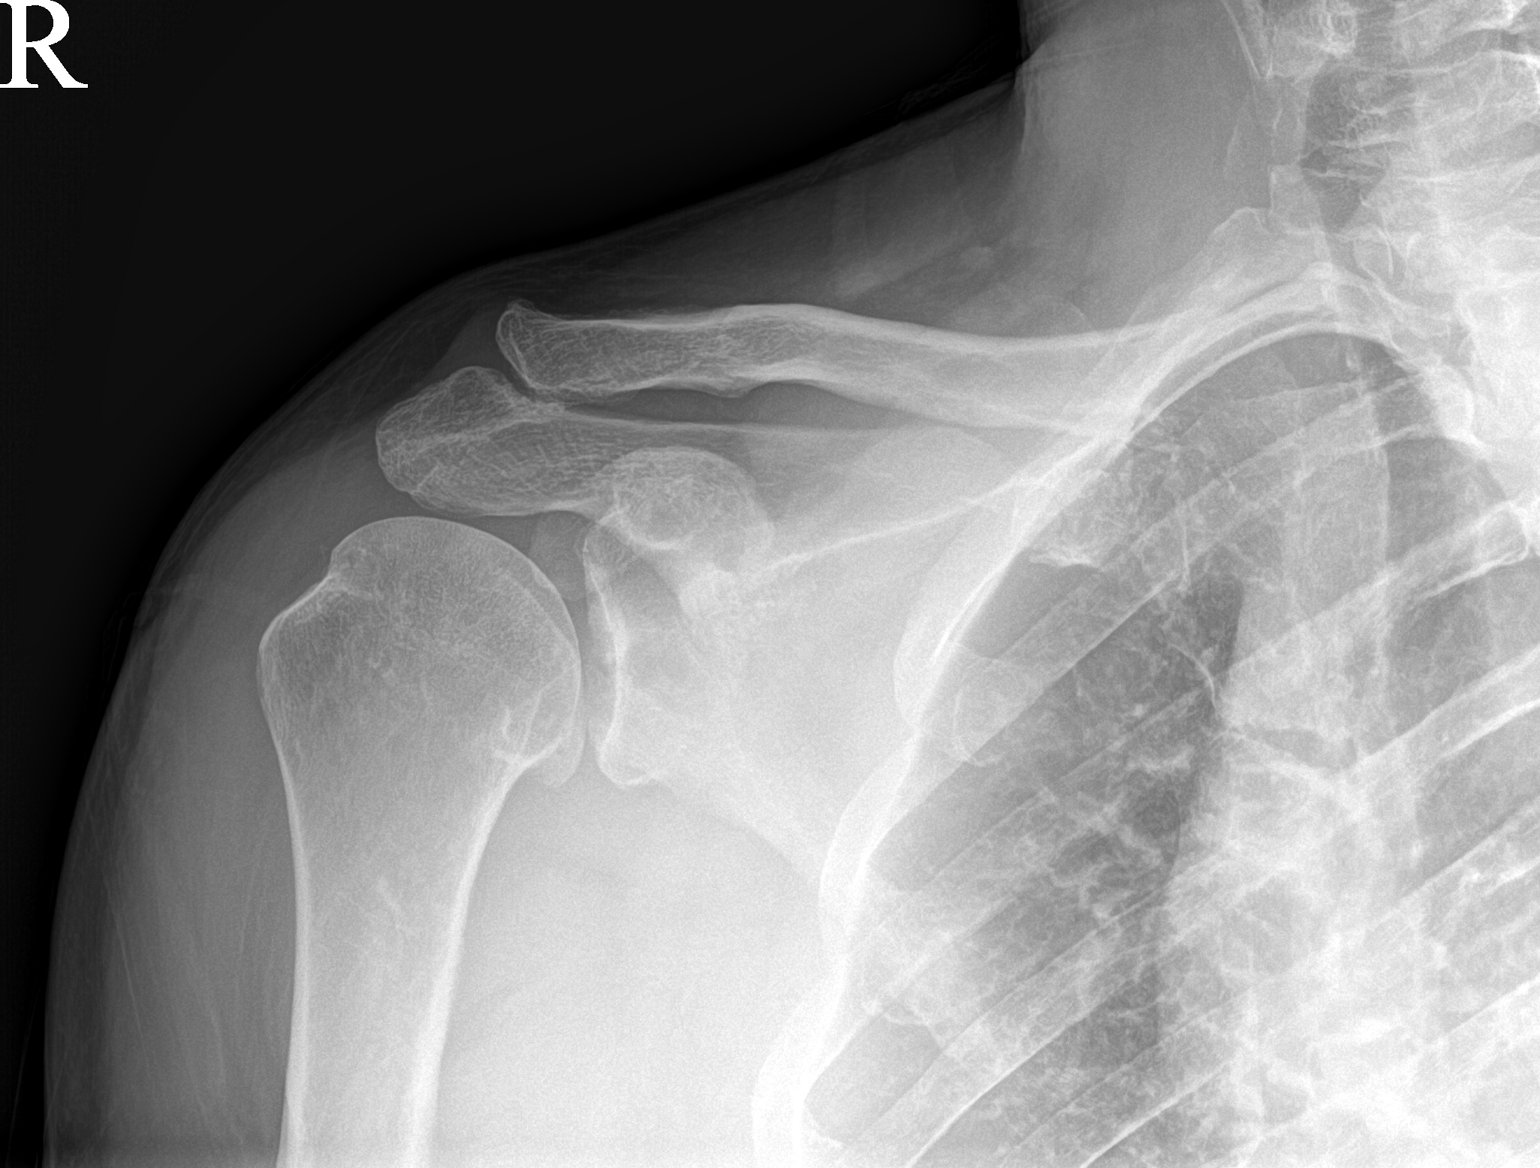

[shoulder ap (2 of 2)]
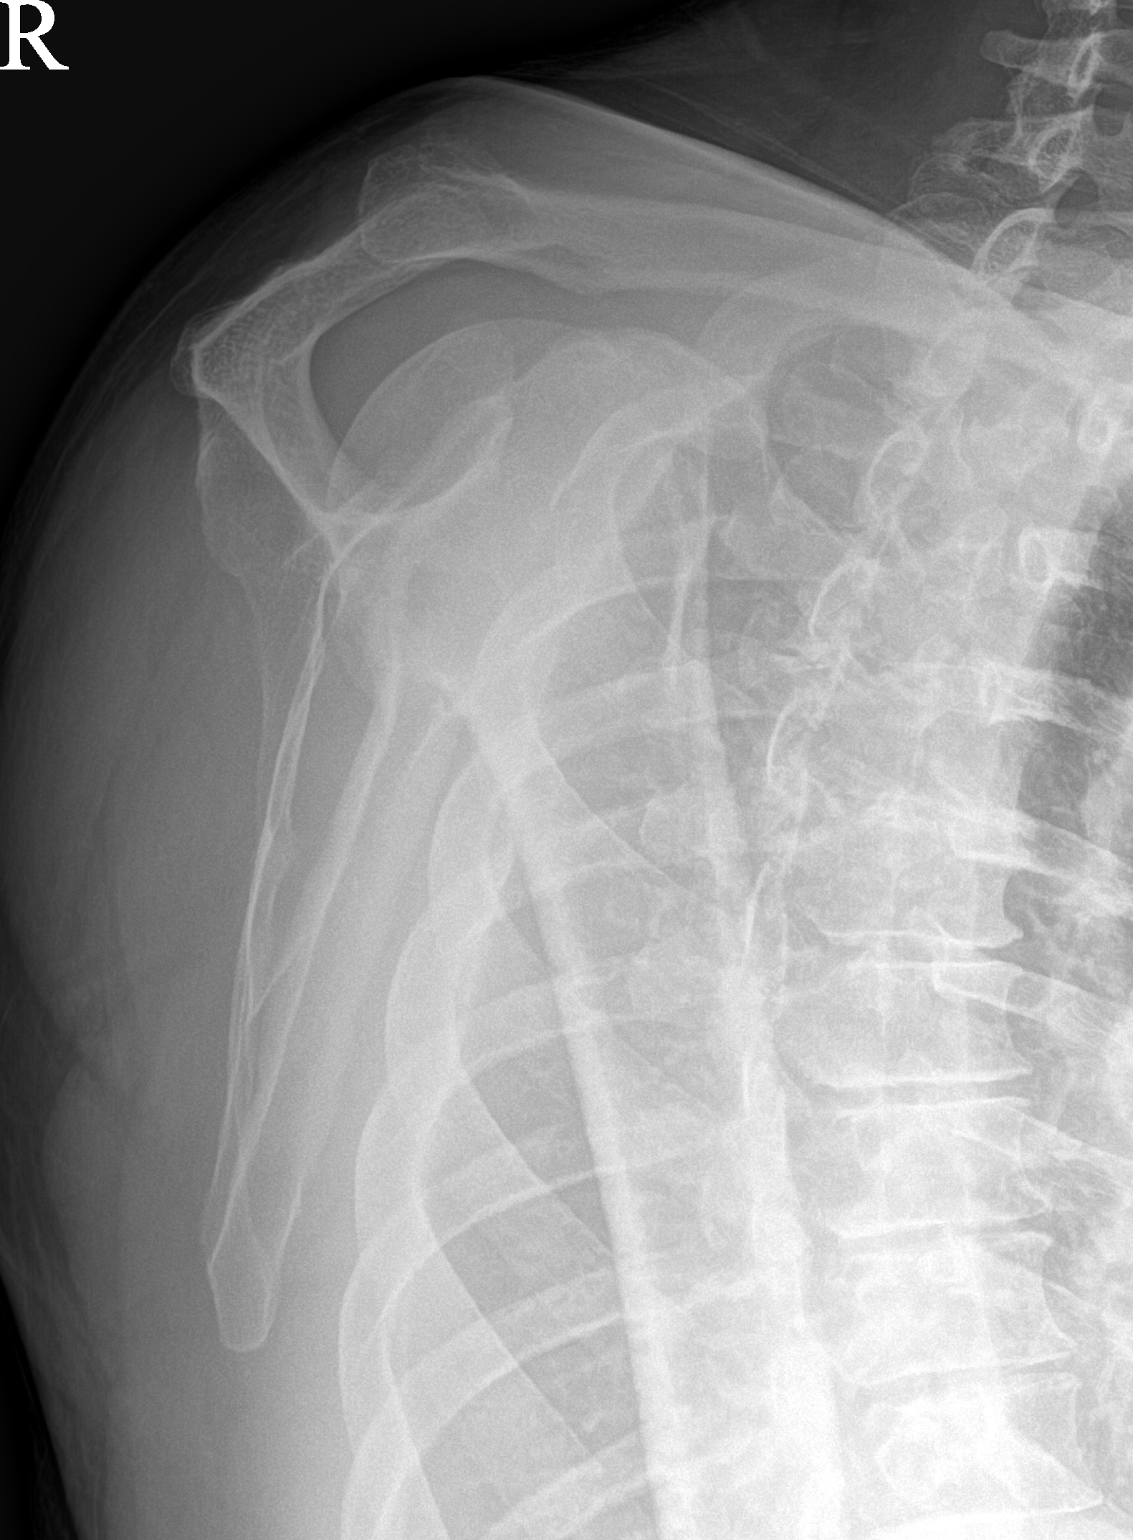

[shoulder axial]
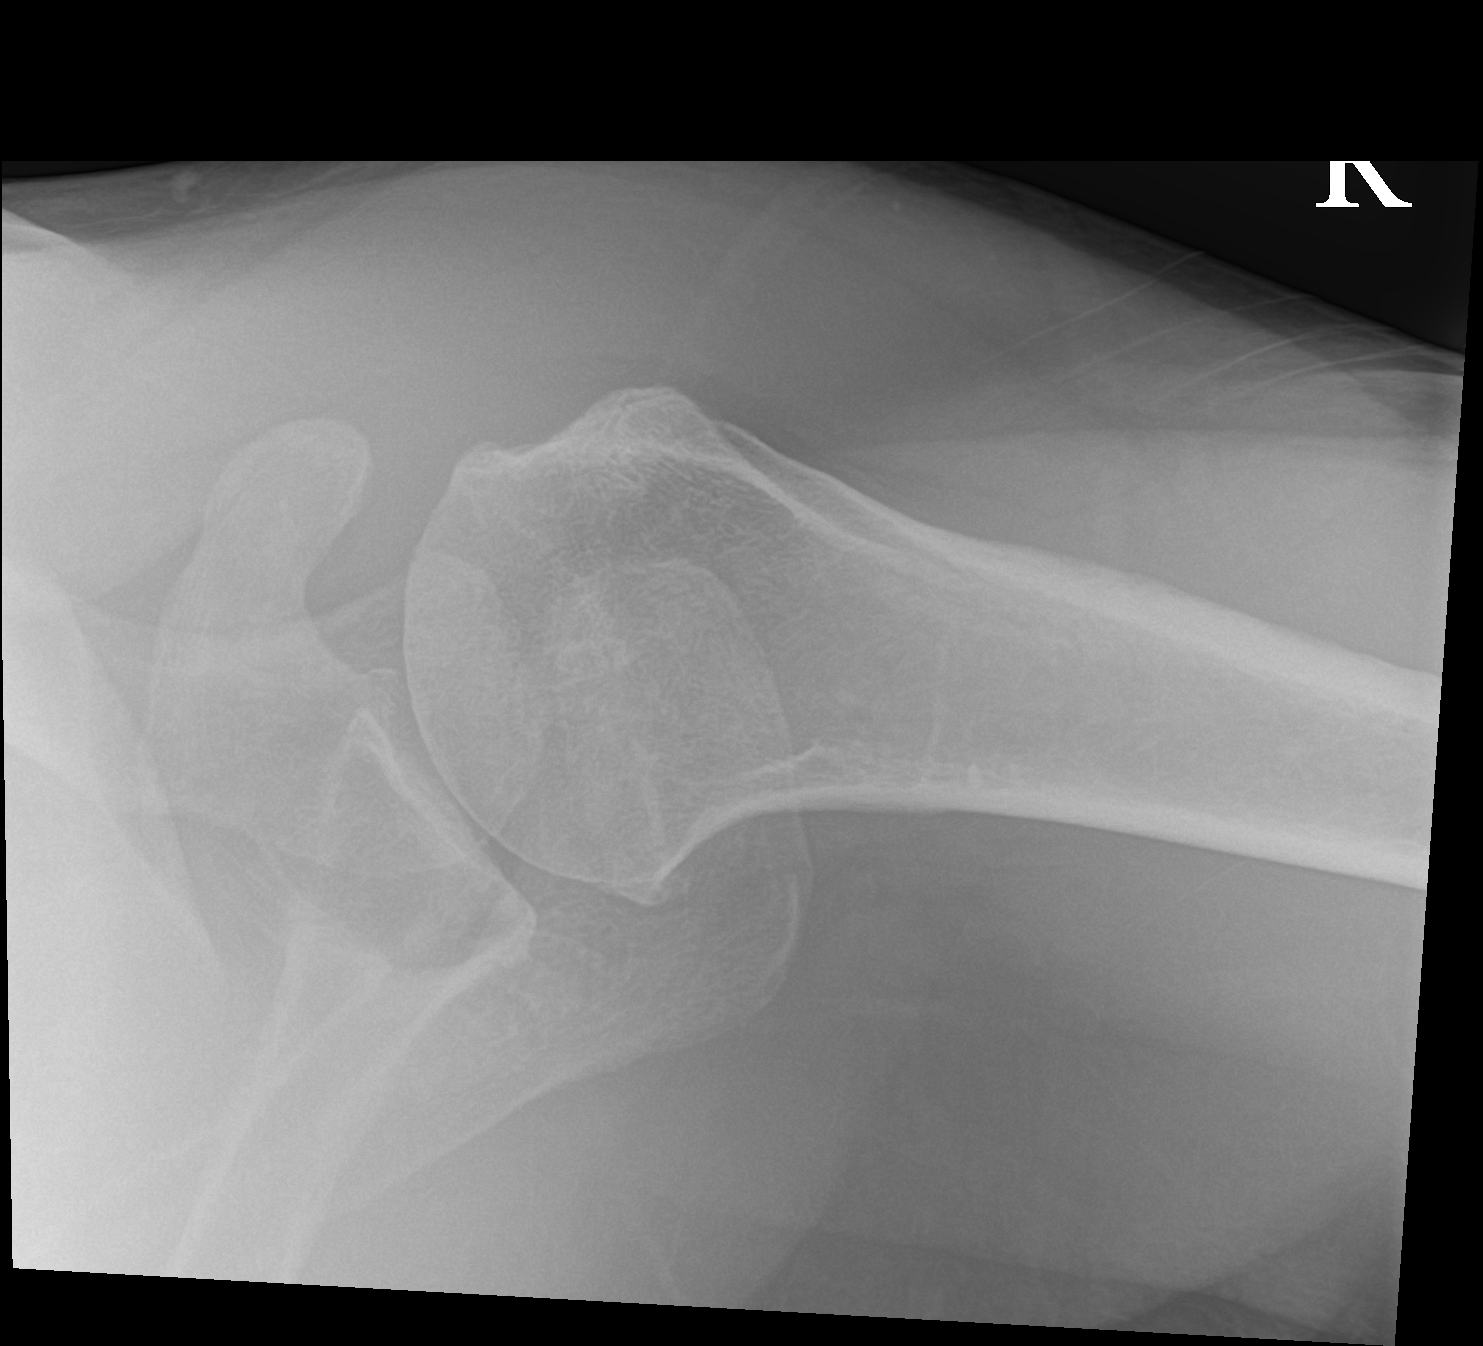

[3 of 3 positions shown; findings below may reference images not displayed]

FINDINGS: Slight progression of right shoulder AC joint and glenohumeral joint
mild degenerative change with joint space loss, sclerosis and bony
spurring. No malalignment or acute osseous finding. No subluxation
or dislocation. Negative for fracture. Included right chest
unremarkable.
IMPRESSION: Mild right shoulder degenerative arthropathy, slightly progressed
compared to 8863.

No acute osseous finding or malalignment.

## 2023-05-29 ENCOUNTER — Other Ambulatory Visit: Payer: Self-pay | Admitting: Family Medicine

## 2023-05-29 ENCOUNTER — Telehealth: Payer: Self-pay | Admitting: Family Medicine

## 2023-05-29 DIAGNOSIS — I10 Essential (primary) hypertension: Secondary | ICD-10-CM

## 2023-05-29 MED ORDER — LISINOPRIL 20 MG PO TABS: ORAL_TABLET | ORAL | 2 refills | Status: DC

## 2023-05-29 NOTE — Telephone Encounter (Signed)
Refilled send to pharmacy on file

## 2023-05-29 NOTE — Telephone Encounter (Signed)
Pt called in checking status of refill of Lisinopril 20 mg . I let him know this is still being worked on.

## 2023-05-30 NOTE — Telephone Encounter (Signed)
Duplicate- Rx 05/29/23 #90 2RF Requested Prescriptions  Pending Prescriptions Disp Refills   lisinopril (ZESTRIL) 20 MG tablet [Pharmacy Med Name: LISINOPRIL 20MG  TABLETS] 90 tablet 2    Sig: TAKE 1 TABLET BY MOUTH DAILY     Cardiovascular:  ACE Inhibitors Failed - 05/29/2023 12:58 PM      Failed - Cr in normal range and within 180 days    Creat  Date Value Ref Range Status  08/05/2021 1.38 (H) 0.70 - 1.35 mg/dL Final   Creatinine, Ser  Date Value Ref Range Status  12/05/2022 1.55 (H) 0.76 - 1.27 mg/dL Final         Passed - K in normal range and within 180 days    Potassium  Date Value Ref Range Status  12/05/2022 4.4 3.5 - 5.2 mmol/L Final  04/29/2013 4.4 3.5 - 5.1 mmol/L Final         Passed - Patient is not pregnant      Passed - Last BP in normal range    BP Readings from Last 1 Encounters:  12/05/22 132/78         Passed - Valid encounter within last 6 months    Recent Outpatient Visits           5 months ago Primary hypertension   [place] [facility] Malva Limes, MD   1 year ago Need for shingles vaccine   [facility] Malva Limes, MD   1 year ago Annual physical exam   [facility] Malva Limes, MD   2 years ago Essential (primary) hypertension   [place] [facility] Malva Limes, MD   2 years ago Essential (primary) hypertension   [place] [facility] Malva Limes, MD

## 2023-11-21 ENCOUNTER — Other Ambulatory Visit: Payer: Self-pay | Admitting: Family Medicine

## 2023-11-21 DIAGNOSIS — I10 Essential (primary) hypertension: Secondary | ICD-10-CM

## 2023-11-21 NOTE — Telephone Encounter (Unsigned)
 Copied from CRM [id number]. Topic: Clinical - Medication Refill >> Nov 21, 2023  2:53 PM [place] B wrote: Most Recent Primary Care Visit:  Provider: Malva Limes  Department: [id number]  Visit Type: OFFICE VISIT  Date: 12/05/2022  Medication: lisinopril (ZESTRIL) 20 MG tablet  Has the patient contacted their pharmacy? no Pt thought he had to call in to practice  Is this the correct pharmacy for this prescription? yes This is the patient's preferred pharmacy:   [id number] [place], [place] - [address] AT [facility] OF [facility] [address] [place] [address] Phone: [phone] Fax: [phone]   Has the prescription been filled recently? no  Is the patient out of the medication? yes  Has the patient been seen for an appointment in the last year OR does the patient have an upcoming appointment? yes  Can we respond through MyChart? yes  Agent: Please be advised that Rx refills may take up to 3 business days. We ask that you follow-up with your pharmacy.

## 2023-11-23 MED ORDER — LISINOPRIL 20 MG PO TABS: ORAL_TABLET | ORAL | 0 refills | Status: DC

## 2023-11-23 NOTE — Telephone Encounter (Signed)
 OV needed for additional refills, 30 day supply given until OV can be made.  Requested Prescriptions  Pending Prescriptions Disp Refills   lisinopril (ZESTRIL) 20 MG tablet 60 tablet 0    Sig: TAKE 1 TABLET(20 MG) BY MOUTH TWICE DAILY     Cardiovascular:  ACE Inhibitors Failed - 11/23/2023  8:25 AM      Failed - Cr in normal range and within 180 days    Creat  Date Value Ref Range Status  08/05/2021 1.38 (H) 0.70 - 1.35 mg/dL Final   Creatinine, Ser  Date Value Ref Range Status  12/05/2022 1.55 (H) 0.76 - 1.27 mg/dL Final         Failed - K in normal range and within 180 days    Potassium  Date Value Ref Range Status  12/05/2022 4.4 3.5 - 5.2 mmol/L Final  04/29/2013 4.4 3.5 - 5.1 mmol/L Final         Failed - Valid encounter within last 6 months    Recent Outpatient Visits           11 months ago Primary hypertension   [place] [facility] Malva Limes, MD   1 year ago Need for shingles vaccine   [facility] Malva Limes, MD   2 years ago Annual physical exam   [facility] Malva Limes, MD   2 years ago Essential (primary) hypertension   [place] [facility] Malva Limes, MD   2 years ago Essential (primary) hypertension   [place] [facility] Malva Limes, MD              Passed - Patient is not pregnant      Passed - Last BP in normal range    BP Readings from Last 1 Encounters:  12/05/22 132/78

## 2024-01-03 ENCOUNTER — Telehealth: Payer: Self-pay

## 2024-01-03 NOTE — Telephone Encounter (Signed)
 Copied from CRM [id number]. Topic: General - Other >> Jan 03, 2024 10:09 AM Marissa P wrote: Reason for CRM: Patient would like to be notified if there is a cancellation for something sooner, I was unable to add to a wait list unsure why.

## 2024-01-03 NOTE — Telephone Encounter (Signed)
 I was able to go in and add patient to the wait list

## 2024-01-08 ENCOUNTER — Ambulatory Visit (INDEPENDENT_AMBULATORY_CARE_PROVIDER_SITE_OTHER): Admitting: Family Medicine

## 2024-01-08 ENCOUNTER — Encounter: Payer: Self-pay | Admitting: Family Medicine

## 2024-01-08 VITALS — BP 140/82 | HR 72 | Resp 16 | Ht 70.0 in | Wt 228.3 lb

## 2024-01-08 DIAGNOSIS — I10 Essential (primary) hypertension: Secondary | ICD-10-CM

## 2024-01-08 DIAGNOSIS — Z125 Encounter for screening for malignant neoplasm of prostate: Secondary | ICD-10-CM

## 2024-01-08 MED ORDER — LISINOPRIL 40 MG PO TABS: 40.0000 mg | ORAL_TABLET | Freq: Every day | ORAL | 0 refills | Status: DC

## 2024-01-08 NOTE — Progress Notes (Signed)
 Established patient visit   Patient: Jared Mccann   DOB: [birth year]   68 y.o. Male  MRN: [id number] Visit Date: 01/08/2024  Today's healthcare provider: Jeralene Mom, MD   Chief Complaint  Patient presents with   Medical Management of Chronic Issues   Subjective    Discussed the use of AI scribe software for clinical note transcription with the patient, who gave verbal consent to proceed.  History of Present Illness   Jared Mccann "Basil Boston" is a 68 year old male with hypertension who presents for a follow-up visit regarding blood pressure management.  He has been taking lisinopril  once daily instead of the prescribed twice daily due to insurance cost issues. His blood pressure remains elevated, with a recent reading of 142/90 mmHg. He has a blood pressure cuff at home but has not been using it regularly. No chest pain, heart flutter, or shortness of breath.  He is on testosterone  injections three times a week managed at another clinic, which he reports has improved his energy levels and the healing of minor cuts. Since starting testosterone , he feels he can maintain his strength and muscle mass more easily.  His family history includes atrial fibrillation in his mother, dementia in his father, and ALS in his paternal grandfather. He works at a [employer] in [place], involved in Engineer, site and Designer, multimedia.       Medications: Outpatient Medications Prior to Visit  Medication Sig   Doxylamine Succinate, Sleep, (SLEEP AID PO) Take by mouth. Nyquil   GLUCOSAMINE-CHONDROIT-VIT C-MN PO Take 1 tablet by mouth daily.   Krill Oil 300 MG CAPS Take 1 tablet by mouth daily.   MAGNESIUM PO Take 1 tablet by mouth daily.   MELATONIN PO Take by mouth.   Multiple Vitamins-Minerals (MULTIVITAMIN ADULT PO) Take 1 tablet by mouth daily.   Saw Palmetto, Serenoa repens, (SAW PALMETTO PO) Take by mouth.   TURMERIC PO Take by mouth.   lisinopril  (ZESTRIL ) 20 MG tablet TAKE 1  TABLET(20 MG) BY MOUTH TWICE DAILY (Patient taking differently: TAKE 1 TABLET(20 MG) BY MOUTH TWICE DAILY)   No facility-administered medications prior to visit.   Review of Systems  Constitutional:  Negative for appetite change, chills and fever.  Respiratory:  Negative for chest tightness, shortness of breath and wheezing.   Cardiovascular:  Negative for chest pain and palpitations.  Gastrointestinal:  Negative for abdominal pain, nausea and vomiting.       Objective    BP (!) 140/82 (BP Location: Right Arm, Patient Position: Sitting, Cuff Size: Large)   Pulse 72   Resp 16   Ht 5\' 10"  (1.778 m)   Wt 228 lb 4.8 oz (103.6 kg)   SpO2 98%   BMI 32.76 kg/m   Physical Exam  General appearance: Overweight male, cooperative and in no acute distress Head: Normocephalic, without obvious abnormality, atraumatic Respiratory: Respirations even and unlabored, normal respiratory rate Extremities: All extremities are intact.  Skin: Skin color, texture, turgor normal. No rashes seen  Psych: Appropriate mood and affect. Neurologic: Mental status: Alert, oriented to person, place, and time, thought content appropriate.    Assessment & Plan        Hypertension Hypertension suboptimally controlled. Blood pressure 142/90 mmHg. Lisinopril  taken inconsistently due to cost. Discussed increasing lisinopril  to 40 mg daily. Advised cardiovascular exercise benefits. - Prescribe lisinopril  40 mg daily. - Advise home blood pressure monitoring and recording. - Recommend taking lisinopril  at night. - Order met  c, lipids, and PSA - Follow up with blood pressure readings to assess dosage adjustment. - Send prescription to [facility] at [employer].         Jeralene Mom, MD  [facility] [phone] (phone) [phone] (fax)  [facility]

## 2024-01-09 ENCOUNTER — Encounter: Payer: Self-pay | Admitting: Family Medicine

## 2024-01-11 LAB — COMPREHENSIVE METABOLIC PANEL WITH GFR
ALT: 21 IU/L (ref 0–44)
AST: 20 IU/L (ref 0–40)
Albumin: 4.2 g/dL (ref 3.9–4.9)
Alkaline Phosphatase: 65 IU/L (ref 44–121)
BUN/Creatinine Ratio: 14 (ref 10–24)
BUN: 18 mg/dL (ref 8–27)
Bilirubin Total: 0.5 mg/dL (ref 0.0–1.2)
CO2: 19 mmol/L — ABNORMAL LOW (ref 20–29)
Calcium: 8.9 mg/dL (ref 8.6–10.2)
Chloride: 103 mmol/L (ref 96–106)
Creatinine, Ser: 1.33 mg/dL — ABNORMAL HIGH (ref 0.76–1.27)
Globulin, Total: 1.6 g/dL (ref 1.5–4.5)
Glucose: 100 mg/dL — ABNORMAL HIGH (ref 70–99)
Potassium: 4.5 mmol/L (ref 3.5–5.2)
Sodium: 138 mmol/L (ref 134–144)
Total Protein: 5.8 g/dL — ABNORMAL LOW (ref 6.0–8.5)
eGFR: 59 mL/min/{1.73_m2} — ABNORMAL LOW (ref >59)

## 2024-01-11 LAB — LIPID PANEL
Chol/HDL Ratio: 4.4 ratio (ref 0.0–5.0)
Cholesterol, Total: 148 mg/dL (ref 100–199)
HDL: 34 mg/dL — ABNORMAL LOW (ref >39)
LDL Chol Calc (NIH): 88 mg/dL (ref 0–99)
Triglycerides: 145 mg/dL (ref 0–149)
VLDL Cholesterol Cal: 26 mg/dL (ref 5–40)

## 2024-01-11 LAB — PSA TOTAL (REFLEX TO FREE): Prostate Specific Ag, Serum: 1.4 ng/mL (ref 0.0–4.0)

## 2024-01-16 ENCOUNTER — Encounter: Payer: Self-pay | Admitting: Family Medicine

## 2024-01-22 ENCOUNTER — Ambulatory Visit: Admitting: Family Medicine

## 2024-04-04 ENCOUNTER — Other Ambulatory Visit: Payer: Self-pay | Admitting: Family Medicine

## 2024-04-04 DIAGNOSIS — I10 Essential (primary) hypertension: Secondary | ICD-10-CM

## 2024-04-13 ENCOUNTER — Other Ambulatory Visit: Payer: Self-pay | Admitting: Family Medicine

## 2024-04-13 DIAGNOSIS — I10 Essential (primary) hypertension: Secondary | ICD-10-CM

## 2024-06-26 ENCOUNTER — Other Ambulatory Visit: Payer: Self-pay | Admitting: Family Medicine

## 2024-06-26 DIAGNOSIS — I10 Essential (primary) hypertension: Secondary | ICD-10-CM

## 2024-06-26 NOTE — Telephone Encounter (Unsigned)
 Copied from CRM #[id number]. Topic: Clinical - Medication Refill >> Jun 26, 2024  9:56 AM Avram MATSU wrote: Medication: lisinopril  (ZESTRIL ) 40 MG tablet [[id number]]  Has the patient contacted their pharmacy? Yes (Agent: If no, request that the patient contact the pharmacy for the refill. If patient does not wish to contact the pharmacy document the reason why and proceed with request.) (Agent: If yes, when and what did the pharmacy advise?)  This is the patient's preferred pharmacy:  [id number] [place], [place] - [address] AT [facility] & [address] [place] [address] Phone: [phone] Fax: [phone]  Is this the correct pharmacy for this prescription? Yes If no, delete pharmacy and type the correct one.   Has the prescription been filled recently? No  Is the patient out of the medication? No  Has the patient been seen for an appointment in the last year OR does the patient have an upcoming appointment? Yes  Can we respond through MyChart? Yes  Agent: Please be advised that Rx refills may take up to 3 business days. We ask that you follow-up with your pharmacy.

## 2024-06-27 NOTE — Telephone Encounter (Signed)
 Requested Prescriptions  Refused Prescriptions Disp Refills   lisinopril  (ZESTRIL ) 40 MG tablet 90 tablet 2     Cardiovascular:  ACE Inhibitors Failed - 06/27/2024  5:58 PM      Failed - Cr in normal range and within 180 days    Creat  Date Value Ref Range Status  08/05/2021 1.38 (H) 0.70 - 1.35 mg/dL Final   Creatinine, Ser  Date Value Ref Range Status  01/08/2024 1.33 (H) 0.76 - 1.27 mg/dL Final         Failed - Last BP in normal range    BP Readings from Last 1 Encounters:  01/08/24 (!) 140/82         Passed - K in normal range and within 180 days    Potassium  Date Value Ref Range Status  01/08/2024 4.5 3.5 - 5.2 mmol/L Final  04/29/2013 4.4 3.5 - 5.1 mmol/L Final         Passed - Patient is not pregnant      Passed - Valid encounter within last 6 months    Recent Outpatient Visits           5 months ago Prostate cancer screening   [facility] Gasper Nancyann BRAVO, MD

## 2024-07-08 ENCOUNTER — Telehealth: Payer: Self-pay | Admitting: Family Medicine

## 2024-07-27 ENCOUNTER — Other Ambulatory Visit: Payer: Self-pay | Admitting: Family Medicine

## 2024-07-27 DIAGNOSIS — I10 Essential (primary) hypertension: Secondary | ICD-10-CM

## 2024-07-27 MED ORDER — LISINOPRIL 40 MG PO TABS: 40.0000 mg | ORAL_TABLET | Freq: Every day | ORAL | 1 refills | Status: AC
